# Patient Record
Sex: Female | Born: 1995 | Race: White | Hispanic: No | Marital: Single | Smoking: Never smoker
Health system: Southern US, Community
[De-identification: ages and names within clinical notes are randomized; demographics above are authoritative.]

## PROBLEM LIST (undated history)

## (undated) ENCOUNTER — Inpatient Hospital Stay: Payer: Self-pay

## (undated) DIAGNOSIS — Z87440 Personal history of urinary (tract) infections: Secondary | ICD-10-CM

## (undated) HISTORY — DX: Personal history of urinary (tract) infections: Z87.440

## (undated) HISTORY — PX: KIDNEY SURGERY: SHX687

---

## 2016-10-23 ENCOUNTER — Other Ambulatory Visit: Payer: Self-pay | Admitting: Obstetrics and Gynecology

## 2016-10-23 ENCOUNTER — Encounter: Payer: Self-pay | Admitting: Obstetrics and Gynecology

## 2016-10-23 ENCOUNTER — Ambulatory Visit (INDEPENDENT_AMBULATORY_CARE_PROVIDER_SITE_OTHER): Payer: Medicaid Other

## 2016-10-23 ENCOUNTER — Ambulatory Visit (INDEPENDENT_AMBULATORY_CARE_PROVIDER_SITE_OTHER): Payer: Medicaid Other | Admitting: Obstetrics and Gynecology

## 2016-10-23 VITALS — BP 111/71 | HR 111 | Ht 67.0 in | Wt 184.6 lb

## 2016-10-23 DIAGNOSIS — Z3402 Encounter for supervision of normal first pregnancy, second trimester: Secondary | ICD-10-CM

## 2016-10-23 DIAGNOSIS — Z3492 Encounter for supervision of normal pregnancy, unspecified, second trimester: Secondary | ICD-10-CM

## 2016-10-23 DIAGNOSIS — Z23 Encounter for immunization: Secondary | ICD-10-CM

## 2016-10-23 DIAGNOSIS — Z131 Encounter for screening for diabetes mellitus: Secondary | ICD-10-CM

## 2016-10-23 DIAGNOSIS — Z369 Encounter for antenatal screening, unspecified: Secondary | ICD-10-CM

## 2016-10-23 DIAGNOSIS — O0932 Supervision of pregnancy with insufficient antenatal care, second trimester: Secondary | ICD-10-CM

## 2016-10-23 DIAGNOSIS — Z113 Encounter for screening for infections with a predominantly sexual mode of transmission: Secondary | ICD-10-CM

## 2016-10-23 LAB — OB RESULTS CONSOLE VARICELLA ZOSTER ANTIBODY, IGG: Varicella: IMMUNE

## 2016-10-23 MED ORDER — CITRANATAL HARMONY 27-1-260 MG PO CAPS: 27.0000 mg | ORAL_CAPSULE | Freq: Every day | ORAL | 11 refills | Status: DC

## 2016-10-23 NOTE — Progress Notes (Signed)
OBSTETRIC INITIAL PRENATAL VISIT  Subjective:    Paige Rodriguez is being seen today for her first obstetrical visit.  This is not a planned pregnancy.  Patient had confirmation at  [facility] and 1 visit. She is a G1P0 female at 1044w2d gestation, Estimated Date of Delivery: 03/24/17 with Patient's last menstrual period was 06/17/2016 (approximate).  Her obstetrical history is significant for none.  I Relationship with FOB: significant other, not living together. Patient does intend to breast feed. Pregnancy history fully reviewed.    Obstetric History   G1   P0   T0   P0   A0   L0    SAB0   TAB0   Ectopic0   Multiple0   Live Births0     # Outcome Date GA Lbr Len/2nd Weight Sex Delivery Anes PTL Lv  1 Current               Gynecologic History:  No prior pap history due to age.  Denies history of STIs.    Past Medical History:  Diagnosis Date  . History of recurrent UTIs    age 89-7, had surgery to correct on renal tubes     No family history on file.   Past Surgical History:  Procedure Laterality Date  . KIDNEY SURGERY     had surgery on renal tubules for h/o recurrent UTIs at ~ age 89     Social History   Social History  . Marital status: Single    Spouse name: N/A  . Number of children: N/A  . Years of education: N/A   Occupational History  . Not on file.   Social History Main Topics  . Smoking status: Never Smoker  . Smokeless tobacco: Never Used  . Alcohol use Not on file  . Drug use: No  . Sexual activity: Not on file   Other Topics Concern  . Not on file   Social History Narrative  . No narrative on file     No current outpatient prescriptions on file prior to visit.   No current facility-administered medications on file prior to visit.      No Known Allergies   Review of Systems General:Not Present- Fever, Weight Loss and Weight Gain. Skin:Not Present- Rash. HEENT:Not Present- Blurred Vision, Headache and Bleeding Gums. Respiratory:Not  Present- Difficulty Breathing. Breast:Not Present- Breast Mass. Cardiovascular:Not Present- Chest Pain, Elevated Blood Pressure, Fainting / Blacking Out and Shortness of Breath. Gastrointestinal:Not Present- Abdominal Pain, Constipation, Nausea and Vomiting. Female Genitourinary:Not Present- Frequency, Painful Urination, Pelvic Pain, Vaginal Bleeding, Vaginal Discharge, Contractions, regular, Fetal Movements Decreased, Urinary Complaints and Vaginal Fluid. Musculoskeletal:Not Present- Back Pain and Leg Cramps. Neurological:Not Present- Dizziness. Psychiatric:Not Present- Depression.     Objective:   Blood pressure 111/71, pulse (!) 111, height 5\' 7"  (1.702 m), weight 184 lb 9.6 oz (83.7 kg), last menstrual period 06/17/2016.  Body mass index is 28.91 kg/m.  General Appearance:    Alert, cooperative, no distress, appears stated age, overweight  Head:    Normocephalic, without obvious abnormality, atraumatic  Eyes:    PERRL, conjunctiva/corneas clear, EOM's intact, both eyes  Ears:    Normal external ear canals, both ears  Nose:   Nares normal, septum midline, mucosa normal, no drainage or sinus tenderness  Throat:   Lips, mucosa, and tongue normal; teeth and gums normal  Neck:   Supple, symmetrical, trachea midline, no adenopathy; thyroid: no enlargement/tenderness/nodules; no carotid bruit or JVD  Back:     Symmetric,  no curvature, ROM normal, no CVA tenderness  Lungs:     Clear to auscultation bilaterally, respirations unlabored  Chest Wall:    No tenderness or deformity   Heart:    Regular rate and rhythm, S1 and S2 normal, no murmur, rub or gallop  Breast Exam:    No tenderness, masses, or nipple abnormality  Abdomen:     Soft, non-tender, bowel sounds active all four quadrants, no masses, no organomegaly.  FH 23 cm.  FHT 148  bpm.  Genitalia:    Pelvic:external genitalia normal, vagina without lesions, discharge, or tenderness, rectovaginal septum  normal. Cervix normal in  appearance, no cervical motion tenderness, no adnexal masses or tenderness.  Pregnancy positive findings: uterine enlargement: 23 wk size, nontender.   Rectal:    Normal external sphincter.  No hemorrhoids appreciated. Internal exam not done.   Extremities:   Extremities normal, atraumatic, no cyanosis or edema  Pulses:   2+ and symmetric all extremities  Skin:   Skin color, texture, turgor normal, no rashes or lesions  Lymph nodes:   Cervical, supraclavicular, and axillary nodes normal  Neurologic:   CNII-XII intact, normal strength, sensation and reflexes throughout       Assessment:   Pregnancy at unknown gestation (size greater than dates today).  Insufficient prenatal care   Plan:   Initial labs ordered. Prenatal vitamins encouraged.  Samples given.  Problem list reviewed and updated. New OB counseling:  The patient has been given an overview regarding routine prenatal care.  Recommendations regarding diet, weight gain, and exercise in pregnancy were given. Prenatal testing, optional genetic testing, and ultrasound use in pregnancy were reviewed.  AFP3 discussed: patient out of range by dates. Discussed quad screen, if patient within range by dates, and also Panorama testin (no limits on gestational age). Patient desires to think about it.  Benefits of Breast Feeding were discussed. The patient is encouraged to consider nursing her baby post partum. Follow up in 4 weeks.  Will likely need glucola screen at that time.  Flu vaccine given today Ultrasound ordered for later today, discussed S>D causes, including incorrect dating from unsure LMP, fetal anomaly, polyhydramnios, or multiple gestation.    50% of 30 min visit spent on counseling and coordination of care.     Hildred LaserAnika Ziona Wickens, MD [facility]

## 2016-10-23 NOTE — Patient Instructions (Signed)

## 2016-10-24 LAB — URINALYSIS, ROUTINE W REFLEX MICROSCOPIC
Bilirubin, UA: NEGATIVE
GLUCOSE, UA: NEGATIVE
Ketones, UA: NEGATIVE
Nitrite, UA: NEGATIVE
PH UA: 7.5 (ref 5.0–7.5)
PROTEIN UA: NEGATIVE
RBC, UA: NEGATIVE
Specific Gravity, UA: 1.015 (ref 1.005–1.030)
Urobilinogen, Ur: 0.2 mg/dL (ref 0.2–1.0)

## 2016-10-24 LAB — CBC WITH DIFFERENTIAL/PLATELET
BASOS: 0 %
Basophils Absolute: 0 10*3/uL (ref 0.0–0.2)
EOS (ABSOLUTE): 0.1 10*3/uL (ref 0.0–0.4)
EOS: 2 %
HEMATOCRIT: 34.9 % (ref 34.0–46.6)
Hemoglobin: 12 g/dL (ref 11.1–15.9)
IMMATURE GRANULOCYTES: 2 %
Immature Grans (Abs): 0.2 10*3/uL — ABNORMAL HIGH (ref 0.0–0.1)
LYMPHS ABS: 1.6 10*3/uL (ref 0.7–3.1)
Lymphs: 18 %
MCH: 29.2 pg (ref 26.6–33.0)
MCHC: 34.4 g/dL (ref 31.5–35.7)
MCV: 85 fL (ref 79–97)
MONOS ABS: 1.2 10*3/uL — AB (ref 0.1–0.9)
Monocytes: 13 %
NEUTROS ABS: 6.1 10*3/uL (ref 1.4–7.0)
NEUTROS PCT: 65 %
Platelets: 216 10*3/uL (ref 150–379)
RBC: 4.11 x10E6/uL (ref 3.77–5.28)
RDW: 14.3 % (ref 12.3–15.4)
WBC: 9.3 10*3/uL (ref 3.4–10.8)

## 2016-10-24 LAB — ABO AND RH: Rh Factor: POSITIVE

## 2016-10-24 LAB — MICROSCOPIC EXAMINATION
CASTS: NONE SEEN /LPF
Epithelial Cells (non renal): >10 /hpf — AB (ref 0–10)

## 2016-10-24 LAB — ANTIBODY SCREEN: Antibody Screen: NEGATIVE

## 2016-10-24 LAB — HEPATITIS B SURFACE ANTIGEN: Hepatitis B Surface Ag: NEGATIVE

## 2016-10-24 LAB — RUBELLA SCREEN: Rubella Antibodies, IGG: 1.75 index (ref >0.99)

## 2016-10-24 LAB — GC/CHLAMYDIA PROBE AMP
CHLAMYDIA, DNA PROBE: NEGATIVE
NEISSERIA GONORRHOEAE BY PCR: NEGATIVE

## 2016-10-24 LAB — VARICELLA ZOSTER ANTIBODY, IGG: VARICELLA: 478 {index} (ref >165)

## 2016-10-24 LAB — RPR: RPR: NONREACTIVE

## 2016-10-24 LAB — HIV ANTIBODY (ROUTINE TESTING W REFLEX): HIV SCREEN 4TH GENERATION: NONREACTIVE

## 2016-10-24 LAB — SICKLE CELL SCREEN: SICKLE CELL SCREEN: NEGATIVE

## 2016-10-25 LAB — URINE CULTURE

## 2016-10-27 ENCOUNTER — Encounter: Payer: Self-pay | Admitting: Obstetrics and Gynecology

## 2016-10-27 DIAGNOSIS — O0932 Supervision of pregnancy with insufficient antenatal care, second trimester: Secondary | ICD-10-CM | POA: Insufficient documentation

## 2016-11-19 ENCOUNTER — Ambulatory Visit (INDEPENDENT_AMBULATORY_CARE_PROVIDER_SITE_OTHER): Payer: Medicaid Other | Admitting: Obstetrics and Gynecology

## 2016-11-19 ENCOUNTER — Other Ambulatory Visit: Payer: Medicaid Other

## 2016-11-19 ENCOUNTER — Encounter: Payer: Medicaid Other | Admitting: Obstetrics and Gynecology

## 2016-11-19 VITALS — BP 109/63 | HR 94 | Wt 189.6 lb

## 2016-11-19 DIAGNOSIS — Z23 Encounter for immunization: Secondary | ICD-10-CM | POA: Diagnosis not present

## 2016-11-19 DIAGNOSIS — Z3009 Encounter for other general counseling and advice on contraception: Secondary | ICD-10-CM

## 2016-11-19 DIAGNOSIS — Z3403 Encounter for supervision of normal first pregnancy, third trimester: Secondary | ICD-10-CM

## 2016-11-19 LAB — POCT URINALYSIS DIPSTICK
Bilirubin, UA: NEGATIVE
GLUCOSE UA: NEGATIVE
Ketones, UA: NEGATIVE
Nitrite, UA: NEGATIVE
PH UA: 7
PROTEIN UA: NEGATIVE
RBC UA: NEGATIVE
SPEC GRAV UA: 1.015
UROBILINOGEN UA: NEGATIVE

## 2016-11-19 NOTE — Progress Notes (Signed)
ROB: Denies complaints. For 28 week labs today.  Desires to breastfeed,  Desires unsure method for contraception. Discussed all forms of contraception, handout given. To discuss at further visits. For Tdap today, signed blood consent, discussed cord blood banking. RTC in 2 weeks.

## 2016-11-19 NOTE — Addendum Note (Signed)
Addended by: Lunette StandsSIEMIENSKI, Shatara Stanek J on: 11/19/2016 03:12 PM   Modules accepted: Orders

## 2016-11-20 LAB — GLUCOSE, 1 HOUR GESTATIONAL: GESTATIONAL DIABETES SCREEN: 98 mg/dL (ref 65–139)

## 2016-12-10 ENCOUNTER — Ambulatory Visit (INDEPENDENT_AMBULATORY_CARE_PROVIDER_SITE_OTHER): Payer: Medicaid Other | Admitting: Obstetrics and Gynecology

## 2016-12-10 VITALS — BP 127/73 | HR 94 | Wt 198.9 lb

## 2016-12-10 DIAGNOSIS — J029 Acute pharyngitis, unspecified: Secondary | ICD-10-CM | POA: Diagnosis not present

## 2016-12-10 DIAGNOSIS — Z3403 Encounter for supervision of normal first pregnancy, third trimester: Secondary | ICD-10-CM

## 2016-12-10 LAB — POCT RAPID STREP A (OFFICE): Rapid Strep A Screen: NEGATIVE

## 2016-12-10 LAB — POCT URINALYSIS DIPSTICK
BILIRUBIN UA: NEGATIVE
GLUCOSE UA: NEGATIVE
KETONES UA: NEGATIVE
Nitrite, UA: NEGATIVE
Protein, UA: NEGATIVE
RBC UA: NEGATIVE
SPEC GRAV UA: 1.015
UROBILINOGEN UA: NEGATIVE
pH, UA: 8

## 2016-12-10 NOTE — Progress Notes (Signed)
ROB: Patient complains of sore throat, difficulty swallowing for 3-4 days. Noted URI symptoms last week which have resolved, but throat symptoms are new.  Also notes feeling chills.  Throat with erythema noted near tonsils, no exudate. Strep throat culture today negative. Advised on throat chloraseptic spray, saline rinse, throat lozenges, and robitussin as needed. RTC in 2 weeks.

## 2016-12-15 NOTE — L&D Delivery Note (Signed)
[facility]  Labor Events:   Preterm labor: no  Rupture date: 02/09/17  Rupture time: 1030  Rupture type: Spontaneous  Fluid Color: clear  Induction: N/A  Augmentation: Pitocin  Complications: Nuchal cord x 1   Cervical ripening:  N/A        Delivery:   Episiotomy: none  Lacerations: 2 degree  Repair suture: 2-0 vicryl , 3-0 vicryl  Repair # of packets: 1 of each  Blood loss (ml): 300   Information for the patient's newborn:  Dorcas CarrowHerold, Girl Victoire [[id number]][[id number]]    Delivery 02/10/2017 5:12 AM by  Vaginal, Spontaneous Delivery Sex:  female Gestational Age: 4138w3d Delivery Clinician:   Living?:         APGARS  One minute Five minutes Ten minutes  Skin color:        Heart rate:        Grimace:        Muscle tone:        Breathing:        Totals: 8  9      Presentation/position:      Resuscitation:   Cord information:    Disposition of cord blood:     Blood gases sent?  Complications:   Placenta: Delivered:       appearance Newborn Measurements: Weight: 8 lb 3.2 oz (3720 g)  Height: 20.47"  Head circumference:    Chest circumference:    Other providers:    Additional  information: Forceps:   Vacuum:   Breech:   Observed anomalies       Delivery Note At 5:12 AM a viable female was delivered via Vaginal, Spontaneous Delivery (Presentation: vertex, ROA  ).  APGAR: 8, 9; weight  .   Placenta status: spontaneous, intact .  Cord:  3 vessels with the following complications: Nuchal x 1 reduced, true knot x 1.  Cord pH: n/a  Anesthesia:  Epidural Episiotomy:  N/a Lacerations: 2 degree perineal  Suture Repair: 2.0 3.0 vicryl Est. Blood Loss (mL):  300  Mom to postpartum.  Baby to Couplet care / Skin to Skin.  Doreene Burkennie Zell Hylton 02/10/2017, 6:06 AM

## 2016-12-24 ENCOUNTER — Ambulatory Visit (INDEPENDENT_AMBULATORY_CARE_PROVIDER_SITE_OTHER): Payer: Medicaid Other | Admitting: Obstetrics and Gynecology

## 2016-12-24 VITALS — BP 99/64 | HR 106 | Wt 201.2 lb

## 2016-12-24 DIAGNOSIS — Z3403 Encounter for supervision of normal first pregnancy, third trimester: Secondary | ICD-10-CM

## 2016-12-24 LAB — POCT URINALYSIS DIPSTICK
BILIRUBIN UA: NEGATIVE
Glucose, UA: NEGATIVE
Ketones, UA: NEGATIVE
Leukocytes, UA: NEGATIVE
NITRITE UA: NEGATIVE
PH UA: 7
PROTEIN UA: NEGATIVE
RBC UA: NEGATIVE
Spec Grav, UA: 1.01
UROBILINOGEN UA: NEGATIVE

## 2016-12-24 NOTE — Progress Notes (Signed)
ROB: Patient denies complaints. Doing well.  RTC in 3 weeks. For 36 week labs at that time.

## 2017-01-14 ENCOUNTER — Ambulatory Visit (INDEPENDENT_AMBULATORY_CARE_PROVIDER_SITE_OTHER): Payer: Medicaid Other | Admitting: Obstetrics and Gynecology

## 2017-01-14 VITALS — BP 121/72 | HR 111 | Wt 200.5 lb

## 2017-01-14 DIAGNOSIS — Z3403 Encounter for supervision of normal first pregnancy, third trimester: Secondary | ICD-10-CM

## 2017-01-14 DIAGNOSIS — Z113 Encounter for screening for infections with a predominantly sexual mode of transmission: Secondary | ICD-10-CM

## 2017-01-14 DIAGNOSIS — Z3493 Encounter for supervision of normal pregnancy, unspecified, third trimester: Secondary | ICD-10-CM | POA: Insufficient documentation

## 2017-01-14 DIAGNOSIS — Z3685 Encounter for antenatal screening for Streptococcus B: Secondary | ICD-10-CM

## 2017-01-14 LAB — POCT URINALYSIS DIPSTICK
Bilirubin, UA: NEGATIVE
Glucose, UA: NEGATIVE
KETONES UA: NEGATIVE
Nitrite, UA: NEGATIVE
PH UA: 7.5
PROTEIN UA: NEGATIVE
SPEC GRAV UA: 1.01
Urobilinogen, UA: NEGATIVE

## 2017-01-14 LAB — OB RESULTS CONSOLE GBS: GBS: NEGATIVE

## 2017-01-14 NOTE — Progress Notes (Signed)
ROB: Doing well, no complaints. 36 week labs today.  Discussed labor precautions. RTC in 1 week.

## 2017-01-16 LAB — GC/CHLAMYDIA PROBE AMP
Chlamydia trachomatis, NAA: NEGATIVE
Neisseria gonorrhoeae by PCR: NEGATIVE

## 2017-01-18 LAB — CULTURE, BETA STREP (GROUP B ONLY): STREP GP B CULTURE: NEGATIVE

## 2017-01-21 ENCOUNTER — Ambulatory Visit (INDEPENDENT_AMBULATORY_CARE_PROVIDER_SITE_OTHER): Payer: Medicaid Other | Admitting: Obstetrics and Gynecology

## 2017-01-21 VITALS — BP 103/65 | HR 97 | Wt 206.2 lb

## 2017-01-21 DIAGNOSIS — Z3403 Encounter for supervision of normal first pregnancy, third trimester: Secondary | ICD-10-CM

## 2017-01-21 DIAGNOSIS — O479 False labor, unspecified: Secondary | ICD-10-CM

## 2017-01-21 LAB — POCT URINALYSIS DIPSTICK
Bilirubin, UA: NEGATIVE
Blood, UA: NEGATIVE
Glucose, UA: NEGATIVE
Ketones, UA: NEGATIVE
Nitrite, UA: NEGATIVE
Protein, UA: NEGATIVE
Spec Grav, UA: 1.025
Urobilinogen, UA: NEGATIVE
pH, UA: 7

## 2017-01-21 NOTE — Progress Notes (Signed)
ROB: Notes feeling tired but otherwise ok.  Has had some back pain, using heating pad. Can use Tylenol prn. Notes some Deberah PeltonBraxton Hicks. Declines exam today. GBS negative. RTC in 1 week.

## 2017-01-23 ENCOUNTER — Observation Stay
Admission: EM | Admit: 2017-01-23 | Discharge: 2017-01-24 | Disposition: A | Discharge status: Home / Self Care | Payer: Medicaid Other | Attending: Obstetrics and Gynecology | Admitting: Obstetrics and Gynecology

## 2017-01-23 DIAGNOSIS — O368130 Decreased fetal movements, third trimester, not applicable or unspecified: Principal | ICD-10-CM | POA: Insufficient documentation

## 2017-01-23 DIAGNOSIS — Z3A38 38 weeks gestation of pregnancy: Secondary | ICD-10-CM | POA: Insufficient documentation

## 2017-01-23 DIAGNOSIS — O0933 Supervision of pregnancy with insufficient antenatal care, third trimester: Secondary | ICD-10-CM | POA: Insufficient documentation

## 2017-01-23 DIAGNOSIS — O368190 Decreased fetal movements, unspecified trimester, not applicable or unspecified: Secondary | ICD-10-CM

## 2017-01-23 NOTE — OB Triage Note (Signed)
Pt presents to l&d with c/o decreased FM x 3 hrs despite attempts to make fetus move. EFM applied and explained, plan to monitor fetal and maternal well being.

## 2017-01-24 ENCOUNTER — Observation Stay: Payer: Medicaid Other

## 2017-01-24 DIAGNOSIS — O368131 Decreased fetal movements, third trimester, fetus 1: Secondary | ICD-10-CM

## 2017-01-24 DIAGNOSIS — O0933 Supervision of pregnancy with insufficient antenatal care, third trimester: Secondary | ICD-10-CM | POA: Diagnosis not present

## 2017-01-24 DIAGNOSIS — O368130 Decreased fetal movements, third trimester, not applicable or unspecified: Secondary | ICD-10-CM | POA: Diagnosis present

## 2017-01-24 DIAGNOSIS — Z3A38 38 weeks gestation of pregnancy: Secondary | ICD-10-CM | POA: Diagnosis not present

## 2017-01-24 NOTE — Discharge Instructions (Signed)
Labor precautions:  Call office or follow up at Labor and Delivery for the following: 1) Contractions less than 7 minutes apart lasting longer than 1 hour 2) Rupture of membranes 3) Vaginal bleeding requiring a pad 4) Decreased fetal movement

## 2017-01-24 NOTE — Final Progress Note (Signed)
L&D OB Triage Note  HPI:  Paige Rodriguez is a 21 y.o. G1P0 female at [redacted]w[redacted]d. Estimated Date of Delivery: 02/07/17 who presented overnight for decreased fetal movement x 3 hours, despite attempting measures at home to promote fetal movement.  Upon arrival to triage after patient was placed on fetal monitors, patient began to detect fetal movement.  While being monitored, the patient was noted to have several variable decelerations from baseline of 140s down to 110-120s.  She was admitted overnight for extended monitoring and ultrasound to assess AFI.     OB History  Gravida Para Term Preterm AB Living  1            SAB TAB Ectopic Multiple Live Births               # Outcome Date GA Lbr Len/2nd Weight Sex Delivery Anes PTL Lv  1 Current               Patient Active Problem List   Diagnosis Date Noted  . Labor and delivery, indication for care 01/24/2017  . Supervision of normal pregnancy in third trimester 01/14/2017  . Insufficient prenatal care in second trimester 10/27/2016    Past Medical History:  Diagnosis Date  . History of recurrent UTIs    age 46-7, had surgery to correct on renal tubes    No current facility-administered medications on file prior to encounter.    Current Outpatient Prescriptions on File Prior to Encounter  Medication Sig Dispense Refill  . acetaminophen (TYLENOL) 325 MG tablet Take 650 mg by mouth every 6 (six) hours as needed.    . Prenat-FeFmCb-DSS-FA-DHA w/o A (CITRANATAL HARMONY) 27-1-260 MG CAPS Take 27 mg by mouth daily. 30 capsule 11    No Known Allergies   ROS:  Review of Systems - Denies vaginal bleeding, contractions, leaking fluids.  Does report decreased movement for several hours.  All remaining review of systems negative.    Physical Exam:  Blood pressure (!) 96/59, pulse 88, temperature 97.4 F (36.3 C), temperature source Oral, resp. rate 18, height 5\' 7"  (1.702 m), weight 206 lb (93.4 kg), last menstrual period 06/17/2016. General  appearance: alert and no distress Abdomen: soft, non-tender; bowel sounds normal; no masses,  no organomegaly, gravid.  Pelvic: deferred Extremities: extremities normal, atraumatic, no cyanosis or edema   NST INTERPRETATION: Indications: decreased fetal movement  Mode: External Baseline Rate (A): 120 bpm Variability: Moderate Accelerations: 15 x 15 Decelerations: None     Contraction Frequency (min): x2  Impression: reactive    Imaging:  CLINICAL DATA:  Decreased fetal movement for 1 day  EXAM: LIMITED OBSTETRIC ULTRASOUND  FINDINGS: Number of Fetuses: 1  Heart Rate:  132 bpm  Movement: Yes  Presentation: Cephalic  Placental Location: Posterior  Previa: No  Amniotic Fluid (Subjective):  Within normal limits.  BPD:  8.97cm 36w  2d  MATERNAL FINDINGS:  Cervix:  Appears closed.  Normal length measuring 3.7 cm.  Uterus/Adnexae:  No abnormality visualized.  IMPRESSION: Single live intrauterine pregnancy as described above.  This exam is performed on an emergent basis and does not comprehensively evaluate fetal size, dating, or anatomy; follow-up complete OB US should be considered if further fetal assessment is warranted.   Electronically Signed   By: Elige Ko   On: 01/24/2017 08:32   Assessment:  21 y.o. G1P0 at [redacted]w[redacted]d with:  1.  Decreased fetal movement 2. Category 2 tracing   Plan:  1. Decreased fetal movement resolved once  patient arrived in triage.  Has noted good movement since that time.  2. Category 2 tracing initially, now improved to Category 1 after extended fetal monitoring.   3. Can d/c home.  Reiterated fetal kick counts, labor precautions.   Hildred LaserAnika Ondra Deboard, MD [facility]

## 2017-01-28 ENCOUNTER — Ambulatory Visit (INDEPENDENT_AMBULATORY_CARE_PROVIDER_SITE_OTHER): Payer: Medicaid Other | Admitting: Obstetrics and Gynecology

## 2017-01-28 VITALS — BP 108/89 | HR 89 | Wt 207.3 lb

## 2017-01-28 DIAGNOSIS — Z3403 Encounter for supervision of normal first pregnancy, third trimester: Secondary | ICD-10-CM

## 2017-01-28 LAB — POCT URINALYSIS DIPSTICK
BILIRUBIN UA: NEGATIVE
GLUCOSE UA: NEGATIVE
KETONES UA: NEGATIVE
Nitrite, UA: NEGATIVE
Protein, UA: NEGATIVE
SPEC GRAV UA: 1.015
Urobilinogen, UA: NEGATIVE
pH, UA: 7

## 2017-01-28 NOTE — Progress Notes (Signed)
ROB: Patient seen in triage over the weekend for complaints of decreased FM.  Notes good FM today.

## 2017-01-30 ENCOUNTER — Telehealth: Payer: Self-pay | Admitting: Obstetrics and Gynecology

## 2017-01-30 NOTE — Telephone Encounter (Signed)
38 wks feels like she's been leaking, decease in appt, sudden headache, today she feels pressure. jusr wanted to talk to someione

## 2017-02-02 NOTE — Telephone Encounter (Signed)
Sent mychart message

## 2017-02-04 ENCOUNTER — Ambulatory Visit (INDEPENDENT_AMBULATORY_CARE_PROVIDER_SITE_OTHER): Payer: Medicaid Other | Admitting: Obstetrics and Gynecology

## 2017-02-04 ENCOUNTER — Encounter: Payer: Self-pay | Admitting: Obstetrics and Gynecology

## 2017-02-04 VITALS — BP 110/71 | HR 97 | Wt 206.4 lb

## 2017-02-04 DIAGNOSIS — Z3403 Encounter for supervision of normal first pregnancy, third trimester: Secondary | ICD-10-CM

## 2017-02-04 LAB — POCT URINALYSIS DIPSTICK
Bilirubin, UA: NEGATIVE
Blood, UA: NEGATIVE
Glucose, UA: NEGATIVE
Ketones, UA: NEGATIVE
LEUKOCYTES UA: NEGATIVE
Nitrite, UA: NEGATIVE
PH UA: 6.5
PROTEIN UA: NEGATIVE
Spec Grav, UA: 1.01
UROBILINOGEN UA: NEGATIVE

## 2017-02-04 NOTE — Progress Notes (Signed)
S&S discussed. NST Monday BPP Tues

## 2017-02-05 ENCOUNTER — Other Ambulatory Visit: Payer: Self-pay | Admitting: Obstetrics and Gynecology

## 2017-02-05 DIAGNOSIS — O368130 Decreased fetal movements, third trimester, not applicable or unspecified: Secondary | ICD-10-CM

## 2017-02-09 ENCOUNTER — Inpatient Hospital Stay: Payer: Medicaid Other | Admitting: Anesthesiology

## 2017-02-09 ENCOUNTER — Inpatient Hospital Stay
Admission: RE | Admit: 2017-02-09 | Discharge: 2017-02-12 | DRG: 775 | Disposition: A | Discharge status: Home / Self Care | Payer: Medicaid Other | Attending: Obstetrics and Gynecology | Admitting: Obstetrics and Gynecology

## 2017-02-09 ENCOUNTER — Ambulatory Visit (INDEPENDENT_AMBULATORY_CARE_PROVIDER_SITE_OTHER): Payer: Medicaid Other | Admitting: Certified Nurse Midwife

## 2017-02-09 ENCOUNTER — Encounter: Payer: Self-pay | Admitting: *Deleted

## 2017-02-09 VITALS — BP 117/76 | HR 92 | Wt 206.0 lb

## 2017-02-09 DIAGNOSIS — O4202 Full-term premature rupture of membranes, onset of labor within 24 hours of rupture: Secondary | ICD-10-CM | POA: Diagnosis present

## 2017-02-09 DIAGNOSIS — O480 Post-term pregnancy: Principal | ICD-10-CM

## 2017-02-09 DIAGNOSIS — Z3A40 40 weeks gestation of pregnancy: Secondary | ICD-10-CM | POA: Diagnosis not present

## 2017-02-09 DIAGNOSIS — Z1389 Encounter for screening for other disorder: Secondary | ICD-10-CM

## 2017-02-09 DIAGNOSIS — Z3403 Encounter for supervision of normal first pregnancy, third trimester: Secondary | ICD-10-CM

## 2017-02-09 DIAGNOSIS — O4290 Premature rupture of membranes, unspecified as to length of time between rupture and onset of labor, unspecified weeks of gestation: Secondary | ICD-10-CM | POA: Diagnosis present

## 2017-02-09 LAB — CBC
HCT: 35.3 % (ref 35.0–47.0)
Hemoglobin: 12.4 g/dL (ref 12.0–16.0)
MCH: 30 pg (ref 26.0–34.0)
MCHC: 35 g/dL (ref 32.0–36.0)
MCV: 85.9 fL (ref 80.0–100.0)
Platelets: 185 K/uL (ref 150–440)
RBC: 4.11 MIL/uL (ref 3.80–5.20)
RDW: 14 % (ref 11.5–14.5)
WBC: 15.5 K/uL — ABNORMAL HIGH (ref 3.6–11.0)

## 2017-02-09 LAB — URINE DRUG SCREEN, QUALITATIVE (ARMC ONLY)
AMPHETAMINES, UR SCREEN: NOT DETECTED
Barbiturates, Ur Screen: NOT DETECTED
Benzodiazepine, Ur Scrn: NOT DETECTED
CANNABINOID 50 NG, UR ~~LOC~~: NOT DETECTED
COCAINE METABOLITE, UR ~~LOC~~: NOT DETECTED
MDMA (ECSTASY) UR SCREEN: NOT DETECTED
Methadone Scn, Ur: NOT DETECTED
Opiate, Ur Screen: NOT DETECTED
PHENCYCLIDINE (PCP) UR S: NOT DETECTED
Tricyclic, Ur Screen: NOT DETECTED

## 2017-02-09 LAB — POCT URINALYSIS DIPSTICK
Bilirubin, UA: NEGATIVE
Glucose, UA: NEGATIVE
Ketones, UA: NEGATIVE
NITRITE UA: NEGATIVE
PH UA: 7.5
Protein, UA: NEGATIVE
Spec Grav, UA: 1.01
UROBILINOGEN UA: NEGATIVE

## 2017-02-09 LAB — CHLAMYDIA/NGC RT PCR (ARMC ONLY)
Chlamydia Tr: NOT DETECTED
N gonorrhoeae: NOT DETECTED

## 2017-02-09 LAB — TYPE AND SCREEN
ABO/RH(D): A POS
Antibody Screen: NEGATIVE

## 2017-02-09 LAB — RAPID HIV SCREEN (HIV 1/2 AB+AG)
HIV 1/2 Antibodies: NONREACTIVE
HIV-1 P24 Antigen - HIV24: NONREACTIVE

## 2017-02-09 MED ORDER — BUTORPHANOL TARTRATE 1 MG/ML IJ SOLN: 1.0000 mg | INTRAMUSCULAR | Status: DC | PRN

## 2017-02-09 MED ORDER — SOD CITRATE-CITRIC ACID 500-334 MG/5ML PO SOLN
30.0000 mL | ORAL | Status: DC | PRN
  Filled 2017-02-09: qty 30

## 2017-02-09 MED ORDER — OXYCODONE-ACETAMINOPHEN 5-325 MG PO TABS: 1.0000 | ORAL_TABLET | ORAL | Status: DC | PRN

## 2017-02-09 MED ORDER — OXYTOCIN 40 UNITS IN LACTATED RINGERS INFUSION - SIMPLE MED
1.0000 m[IU]/min | INTRAVENOUS | Status: DC
  Administered 2017-02-09: 2 m[IU]/min via INTRAVENOUS

## 2017-02-09 MED ORDER — ACETAMINOPHEN 325 MG PO TABS: 650.0000 mg | ORAL_TABLET | ORAL | Status: DC | PRN

## 2017-02-09 MED ORDER — LACTATED RINGERS IV SOLN
INTRAVENOUS | Status: DC
  Administered 2017-02-09 (×2): via INTRAVENOUS

## 2017-02-09 MED ORDER — LIDOCAINE HCL (PF) 1 % IJ SOLN: 30.0000 mL | INTRAMUSCULAR | Status: DC | PRN

## 2017-02-09 MED ORDER — ONDANSETRON HCL 4 MG/2ML IJ SOLN: 4.0000 mg | Freq: Four times a day (QID) | INTRAMUSCULAR | Status: DC | PRN

## 2017-02-09 MED ORDER — OXYTOCIN 40 UNITS IN LACTATED RINGERS INFUSION - SIMPLE MED
2.5000 [IU]/h | INTRAVENOUS | Status: DC
  Administered 2017-02-10: 2.5 [IU]/h via INTRAVENOUS
  Filled 2017-02-09: qty 1000

## 2017-02-09 MED ORDER — TERBUTALINE SULFATE 1 MG/ML IJ SOLN: 0.2500 mg | Freq: Once | INTRAMUSCULAR | Status: DC | PRN

## 2017-02-09 MED ORDER — OXYCODONE-ACETAMINOPHEN 5-325 MG PO TABS: 2.0000 | ORAL_TABLET | ORAL | Status: DC | PRN

## 2017-02-09 MED ORDER — OXYTOCIN BOLUS FROM INFUSION
500.0000 mL | Freq: Once | INTRAVENOUS | Status: AC
  Administered 2017-02-10: 999500 mL via INTRAVENOUS

## 2017-02-09 MED ORDER — LACTATED RINGERS IV SOLN: 500.0000 mL | INTRAVENOUS | Status: DC | PRN

## 2017-02-09 NOTE — OB Triage Note (Signed)
Presents with [facility] at 1030 this morning. Pt went to office first and was nitrazine positive there. States she is having some contractions.

## 2017-02-09 NOTE — Anesthesia Procedure Notes (Signed)
Epidural Patient location during procedure: OB Start time: 02/09/2017 11:40 PM End time: 02/09/2017 11:57 PM  Staffing Anesthesiologist: Alver FisherPENWARDEN, [place] Performed: anesthesiologist   Preanesthetic Checklist Completed: patient identified, site marked, surgical consent, pre-op evaluation, timeout performed, IV checked, risks and benefits discussed and monitors and equipment checked  Epidural Patient position: sitting Prep: ChloraPrep Patient monitoring: heart rate, continuous pulse ox and blood pressure Approach: midline Location: L3-L4 Injection technique: LOR saline  Needle:  Needle type: Tuohy  Needle gauge: 18 G Needle length: 9 cm and 9 Needle insertion depth: 6 cm Catheter type: closed end flexible Catheter size: 20 Guage Catheter at skin depth: 10 cm Test dose: negative (0.125% bupivacaine)  Assessment Events: blood not aspirated, injection not painful, no injection resistance, negative IV test and no paresthesia  Additional Notes   Patient tolerated the insertion well without complications.Reason for block:procedure for pain

## 2017-02-09 NOTE — H&P (Signed)
Obstetric History and Physical  Paige Rodriguez is a 21 y.o. G1P0 with IUP at 6367w2d presenting for complaints of SROM since 10:30 a.m. Patient states she has been having  irregular contractions (mostly MantuaBraxton Hicks), none vaginal bleeding, ruptured, clear fluid membranes, with active fetal movement.   Patient was sent over from clinic today after confirmation of ruptured membranes (pooling on speculum exam and positive nitrazine)  Prenatal Course Source of Care: [facility] with onset of care at 24 weeks Pregnancy complications or risks: Patient Active Problem List   Diagnosis Date Noted  . PROM (premature rupture of membranes) 02/09/2017  . Post-dates pregnancy 02/09/2017  . Supervision of normal pregnancy in third trimester 01/14/2017  . Insufficient prenatal care in second trimester 10/27/2016   She plans to breastfeed She desires unsure method for postpartum contraception.   Prenatal labs and studies: ABO, Rh: --/--/A POS (02/26 1613) Antibody: NEG (02/26 1613) Rubella: 1.75 (11/09 1134) RPR: Non Reactive (11/09 1134)  HBsAg: Negative (11/09 1134)  HIV: Non Reactive (11/09 1134)  ZOX:WRUEAVWUGBS:Negative (01/31 1249) 1 hr Glucola  Normal Genetic screening none performed (declined) Anatomy US normal   Past Medical History:  Diagnosis Date  . History of recurrent UTIs    age [age], had surgery to correct on renal tubes    Past Surgical History:  Procedure Laterality Date  . KIDNEY SURGERY     had surgery on renal tubules for h/o recurrent UTIs    OB History  Gravida Para Term Preterm AB Living  1            SAB TAB Ectopic Multiple Live Births               # Outcome Date GA Lbr Len/2nd Weight Sex Delivery Anes PTL Lv  1 Current               Social History   Social History  . Marital status: Single    Spouse name: N/A  . Number of children: N/A  . Years of education: N/A   Social History Main Topics  . Smoking status: Never Smoker  . Smokeless tobacco:  Never Used  . Alcohol use No  . Drug use: No  . Sexual activity: Yes    Birth control/ protection: Implant   Other Topics Concern  . None   Social History Narrative  . None    History reviewed. No pertinent family history.  Prescriptions Prior to Admission  Medication Sig Dispense Refill Last Dose  . acetaminophen (TYLENOL) 325 MG tablet Take 650 mg by mouth every 6 (six) hours as needed.   Taking  . Prenat-FeFmCb-DSS-FA-DHA w/o A (CITRANATAL HARMONY) 27-1-260 MG CAPS Take 27 mg by mouth daily. 30 capsule 11 Taking    No Known Allergies  Review of Systems: Negative except for what is mentioned in HPI.  Physical Exam: BP 138/88 (BP Location: Left Arm)   Pulse 98   Temp 98.5 F (36.9 C) (Oral)   Resp 18   Ht 5\' 7"  (1.702 m)   Wt 206 lb (93.4 kg)   LMP 06/17/2016 (Approximate)   BMI 32.26 kg/m  CONSTITUTIONAL: Well-developed, well-nourished female in no acute distress.  HENT:  Normocephalic, atraumatic, External right and left ear normal. Oropharynx is clear and moist EYES: Conjunctivae and EOM are normal. Pupils are equal, round, and reactive to light. No scleral icterus.  NECK: Normal range of motion, supple, no masses SKIN: Skin is warm and dry. No rash noted. Not diaphoretic. No  erythema. No pallor. NEUROLOGIC: Alert and oriented to person, place, and time. Normal reflexes, muscle tone coordination. No cranial nerve deficit noted. PSYCHIATRIC: Normal mood and affect. Normal behavior. Normal judgment and thought content. CARDIOVASCULAR: Normal heart rate noted, regular rhythm RESPIRATORY: Effort and breath sounds normal, no problems with respiration noted ABDOMEN: Soft, nontender, nondistended, gravid. MUSCULOSKELETAL: Normal range of motion. No edema and no tenderness. 2+ distal pulses.  Cervical Exam: Dilatation 3cm   Effacement 60%   Station -3.  Grossly ruptured   Presentation: cephalic FHT:  Baseline rate 145 bpm   Variability moderate  Accelerations present    Decelerations none Contractions: Every 3-4 mins   Pertinent Labs/Studies:   Results for orders placed or performed during the hospital encounter of 02/09/17 (from the past 24 hour(s))  CBC     Status: Abnormal   Collection Time: 02/09/17  4:13 PM  Result Value Ref Range   WBC 15.5 (H) 3.6 - 11.0 K/uL   RBC 4.11 3.80 - 5.20 MIL/uL   Hemoglobin 12.4 12.0 - 16.0 g/dL   HCT 16.1 09.6 - 04.5 %   MCV 85.9 80.0 - 100.0 fL   MCH 30.0 26.0 - 34.0 pg   MCHC 35.0 32.0 - 36.0 g/dL   RDW 40.9 81.1 - 91.4 %   Platelets 185 150 - 440 K/uL  Type and screen [facility]     Status: None   Collection Time: 02/09/17  4:13 PM  Result Value Ref Range   ABO/RH(D) A POS    Antibody Screen NEG    Sample Expiration 02/12/2017   Rapid HIV screen (HIV 1/2 Ab+Ag) ([facility] Only)     Status: None   Collection Time: 02/09/17  4:13 PM  Result Value Ref Range   HIV-1 P24 Antigen - HIV24 NON REACTIVE NON REACTIVE   HIV 1/2 Antibodies NON REACTIVE NON REACTIVE   Interpretation (HIV Ag Ab)      A non reactive test result means that HIV 1 or HIV 2 antibodies and HIV 1 p24 antigen were not detected in the specimen.  Urine Drug Screen, Qualitative ([facility] only)     Status: None   Collection Time: 02/09/17  4:13 PM  Result Value Ref Range   Tricyclic, Ur Screen NONE DETECTED NONE DETECTED   Amphetamines, Ur Screen NONE DETECTED NONE DETECTED   MDMA (Ecstasy)Ur Screen NONE DETECTED NONE DETECTED   Cocaine Metabolite,Ur [place] NONE DETECTED NONE DETECTED   Opiate, Ur Screen NONE DETECTED NONE DETECTED   Phencyclidine (PCP) Ur S NONE DETECTED NONE DETECTED   Cannabinoid 50 Ng, Ur [place] NONE DETECTED NONE DETECTED   Barbiturates, Ur Screen NONE DETECTED NONE DETECTED   Benzodiazepine, Ur Scrn NONE DETECTED NONE DETECTED   Methadone Scn, Ur NONE DETECTED NONE DETECTED    Assessment : Paige Rodriguez is a 21 y.o. G1P0 at [redacted]w[redacted]d being admitted for labor.  Plan: Labor: Expectant management. Augmentation as  needed, per protocol.  Admission labs ordered.  May have epidural if desired.  FWB: Reassuring fetal heart tracing.  GBS negative. Delivery plan: Hopeful for vaginal delivery   Hildred Laser, MD [facility]

## 2017-02-09 NOTE — Progress Notes (Signed)
Paige Rodriguez presents today for evaluation of ROM. She describes a gush of clear fluid this morning at approximately 1030 that continues to leak. She is also having mild contractions every 5-10 minutes. She rates her pain 3/10. Sterile Spec exam show clear fluid in the vagina, Nitrazine positive, fern positive. SVE 3-4 cm, 60%,-2 station. She has positive fetal movement with heart tones at 147. She is GBS negative. Paige Rodriguez was instructed to go to Labor and delivery for admission. She requested to have a "snack" prior to admission. She will go get something to eat and then go to L&D. Dr. Valentino Saxonherry notified of finding.   Paige BurkeAnnie Zekiah Rodriguez, CNM

## 2017-02-09 NOTE — Progress Notes (Signed)
Intrapartum Progress Note  S: Patient denies complaints.  Notes that she is feeling contractions but only moderately painful.   O: Blood pressure 118/65, pulse 96, temperature 97.7 F (36.5 C), temperature source Oral, resp. rate 16, height 5\' 7"  (1.702 m), weight 206 lb (93.4 kg), last menstrual period 06/17/2016. Gen App: NAD, comfortable Abdomen: soft, gravid FHT: baseline 140 bpm.  Accels present.  Decels absent. moderate in degree variability.   Tocometer: contractions irregular (q 2-4) minutes Cervix: 3.5/90/-1 Extremities: Nontender, no edema.  Pitocin: None  Labs:  No new labs   Assessment:  1: SIUP at 4662w2d 2. PROM   Plan:  1. Will augment with Pitocin 2. Anticipate vaginal delivery 3. Monitor for s/s of chorioamnionitis if prolong ruptured    Hildred LaserAnika Amyre Segundo, MD 02/09/2017 9:31 PM

## 2017-02-09 NOTE — Patient Instructions (Signed)
May have a snack then go to L&D for admission due to rupture of membranes.

## 2017-02-09 NOTE — Progress Notes (Signed)
Pt presents for NST-post dates. Pt possibly leaking amniotic fluid, for the last couple of hours. FM good. Contractions approx every 5-4610min, lasting approx 1 min.  Dr. Valentino Saxonherry aware and Doreene BurkeAnnie Thompson, CNM will check pt to confirm vaginal fluid.

## 2017-02-10 ENCOUNTER — Encounter: Payer: Medicaid Other | Admitting: Obstetrics and Gynecology

## 2017-02-10 ENCOUNTER — Other Ambulatory Visit: Payer: Medicaid Other

## 2017-02-10 DIAGNOSIS — O480 Post-term pregnancy: Secondary | ICD-10-CM

## 2017-02-10 LAB — RPR: RPR: NONREACTIVE

## 2017-02-10 MED ORDER — FENTANYL 2.5 MCG/ML W/ROPIVACAINE 0.2% IN NS 100 ML EPIDURAL INFUSION (ARMC-ANES)
EPIDURAL | Status: AC
  Filled 2017-02-10: qty 100

## 2017-02-10 MED ORDER — DOCUSATE SODIUM 100 MG PO CAPS
100.0000 mg | ORAL_CAPSULE | Freq: Two times a day (BID) | ORAL | Status: DC
  Administered 2017-02-10 – 2017-02-12 (×4): 100 mg via ORAL
  Filled 2017-02-10 (×5): qty 1

## 2017-02-10 MED ORDER — AMMONIA AROMATIC IN INHA
RESPIRATORY_TRACT | Status: AC
  Filled 2017-02-10: qty 10

## 2017-02-10 MED ORDER — LACTATED RINGERS IV SOLN: INTRAVENOUS | Status: DC

## 2017-02-10 MED ORDER — LIDOCAINE HCL (PF) 1 % IJ SOLN
INTRAMUSCULAR | Status: AC
  Filled 2017-02-10: qty 30

## 2017-02-10 MED ORDER — DIBUCAINE 1 % RE OINT: 1.0000 "application " | TOPICAL_OINTMENT | RECTAL | Status: DC | PRN

## 2017-02-10 MED ORDER — NALBUPHINE HCL 10 MG/ML IJ SOLN: 5.0000 mg | Freq: Once | INTRAMUSCULAR | Status: DC | PRN

## 2017-02-10 MED ORDER — WITCH HAZEL-GLYCERIN EX PADS: 1.0000 "application " | MEDICATED_PAD | CUTANEOUS | Status: DC | PRN

## 2017-02-10 MED ORDER — MISOPROSTOL 200 MCG PO TABS
ORAL_TABLET | ORAL | Status: AC
  Filled 2017-02-10: qty 4

## 2017-02-10 MED ORDER — NALBUPHINE HCL 10 MG/ML IJ SOLN: 5.0000 mg | INTRAMUSCULAR | Status: DC | PRN

## 2017-02-10 MED ORDER — BENZOCAINE-MENTHOL 20-0.5 % EX AERO
1.0000 "application " | INHALATION_SPRAY | CUTANEOUS | Status: DC | PRN
  Administered 2017-02-10: 1 via TOPICAL
  Filled 2017-02-10: qty 56

## 2017-02-10 MED ORDER — ROPIVACAINE HCL 2 MG/ML IJ SOLN
10.0000 mL/h | INTRAMUSCULAR | Status: DC
  Filled 2017-02-10 (×3): qty 5

## 2017-02-10 MED ORDER — DEXTROSE 5 % IV SOLN
1.0000 ug/kg/h | INTRAVENOUS | Status: DC | PRN
  Filled 2017-02-10: qty 2

## 2017-02-10 MED ORDER — LIDOCAINE-EPINEPHRINE (PF) 1.5 %-1:200000 IJ SOLN
INTRAMUSCULAR | Status: DC | PRN
  Administered 2017-02-09: 3 mL

## 2017-02-10 MED ORDER — PRENATAL MULTIVITAMIN CH
1.0000 | ORAL_TABLET | Freq: Every day | ORAL | Status: DC
  Administered 2017-02-10 – 2017-02-12 (×3): 1 via ORAL
  Filled 2017-02-10 (×3): qty 1

## 2017-02-10 MED ORDER — DIPHENHYDRAMINE HCL 25 MG PO CAPS: 25.0000 mg | ORAL_CAPSULE | ORAL | Status: DC | PRN

## 2017-02-10 MED ORDER — SIMETHICONE 80 MG PO CHEW: 80.0000 mg | CHEWABLE_TABLET | ORAL | Status: DC | PRN

## 2017-02-10 MED ORDER — COCONUT OIL OIL
1.0000 "application " | TOPICAL_OIL | Status: DC | PRN
  Administered 2017-02-10: 1 via TOPICAL
  Filled 2017-02-10: qty 120

## 2017-02-10 MED ORDER — DIPHENHYDRAMINE HCL 50 MG/ML IJ SOLN: 12.5000 mg | INTRAMUSCULAR | Status: DC | PRN

## 2017-02-10 MED ORDER — FERROUS SULFATE 325 (65 FE) MG PO TABS
325.0000 mg | ORAL_TABLET | Freq: Every day | ORAL | Status: DC
  Administered 2017-02-11 – 2017-02-12 (×2): 325 mg via ORAL
  Filled 2017-02-10 (×3): qty 1

## 2017-02-10 MED ORDER — ONDANSETRON HCL 4 MG PO TABS: 4.0000 mg | ORAL_TABLET | ORAL | Status: DC | PRN

## 2017-02-10 MED ORDER — SODIUM CHLORIDE 0.9 % IV SOLN
INTRAVENOUS | Status: DC | PRN
  Administered 2017-02-09 (×3): 5 mL via EPIDURAL

## 2017-02-10 MED ORDER — IBUPROFEN 600 MG PO TABS
600.0000 mg | ORAL_TABLET | Freq: Four times a day (QID) | ORAL | Status: DC
  Administered 2017-02-10 – 2017-02-11 (×4): 600 mg via ORAL
  Filled 2017-02-10 (×4): qty 1

## 2017-02-10 MED ORDER — SODIUM CHLORIDE 0.9% FLUSH: 3.0000 mL | INTRAVENOUS | Status: DC | PRN

## 2017-02-10 MED ORDER — NALOXONE HCL 0.4 MG/ML IJ SOLN: 0.4000 mg | INTRAMUSCULAR | Status: DC | PRN

## 2017-02-10 MED ORDER — FENTANYL 2.5 MCG/ML W/ROPIVACAINE 0.2% IN NS 100 ML EPIDURAL INFUSION (ARMC-ANES): 10.0000 mL/h | EPIDURAL | Status: DC

## 2017-02-10 MED ORDER — ONDANSETRON HCL 4 MG/2ML IJ SOLN: 4.0000 mg | INTRAMUSCULAR | Status: DC | PRN

## 2017-02-10 MED ORDER — OXYTOCIN 10 UNIT/ML IJ SOLN
INTRAMUSCULAR | Status: AC
  Filled 2017-02-10: qty 2

## 2017-02-10 MED ORDER — FENTANYL 2.5 MCG/ML W/ROPIVACAINE 0.2% IN NS 100 ML EPIDURAL INFUSION (ARMC-ANES)
EPIDURAL | Status: DC | PRN
  Administered 2017-02-09: 10 mL/h via EPIDURAL

## 2017-02-10 MED ORDER — ONDANSETRON HCL 4 MG/2ML IJ SOLN: 4.0000 mg | Freq: Three times a day (TID) | INTRAMUSCULAR | Status: DC | PRN

## 2017-02-10 MED ORDER — HYDROCODONE-ACETAMINOPHEN 5-325 MG PO TABS
1.0000 | ORAL_TABLET | ORAL | Status: DC | PRN
  Administered 2017-02-10 – 2017-02-11 (×5): 1 via ORAL
  Filled 2017-02-10 (×5): qty 1

## 2017-02-10 NOTE — Anesthesia Preprocedure Evaluation (Addendum)
Anesthesia Evaluation  Patient identified by MRN, date of birth, ID band Patient awake    Reviewed: Allergy & Precautions, NPO status , Patient's Chart, lab work & pertinent test results  History of Anesthesia Complications Negative for: history of anesthetic complications  Airway Mallampati: II  TM Distance: >3 FB Neck ROM: Full    Dental no notable dental hx.    Pulmonary asthma (mild intermittent) , neg sleep apnea, neg COPD,    breath sounds clear to auscultation- rhonchi (-) wheezing      Cardiovascular Exercise Tolerance: Good (-) hypertension(-) CAD and (-) Past MI  Rhythm:Regular Rate:Normal - Systolic murmurs and - Diastolic murmurs    Neuro/Psych negative neurological ROS  negative psych ROS   GI/Hepatic negative GI ROS, Neg liver ROS,   Endo/Other  negative endocrine ROSneg diabetes  Renal/GU negative Renal ROS     Musculoskeletal negative musculoskeletal ROS (+)   Abdominal Gravid abdomen   Peds  Hematology negative hematology ROS (+)   Anesthesia Other Findings   Reproductive/Obstetrics (+) Pregnancy                             Anesthesia Physical Anesthesia Plan  ASA: II  Anesthesia Plan: Epidural   Post-op Pain Management:    Induction:   Airway Management Planned:   Additional Equipment:   Intra-op Plan:   Post-operative Plan:   Informed Consent: I have reviewed the patients History and Physical, chart, labs and discussed the procedure including the risks, benefits and alternatives for the proposed anesthesia with the patient or authorized representative who has indicated his/her understanding and acceptance.     Plan Discussed with: Anesthesiologist  Anesthesia Plan Comments: (Plan for epidural for labor, discussed epidural vs spinal vs GA if need for csection)        Lab Results  Component Value Date   WBC 15.5 (H) 02/09/2017   HGB 12.4  02/09/2017   HCT 35.3 02/09/2017   MCV 85.9 02/09/2017   PLT 185 02/09/2017    Anesthesia Quick Evaluation

## 2017-02-11 LAB — CBC
HCT: 32.7 % — ABNORMAL LOW (ref 35.0–47.0)
HEMOGLOBIN: 11.2 g/dL — AB (ref 12.0–16.0)
MCH: 29.8 pg (ref 26.0–34.0)
MCHC: 34.4 g/dL (ref 32.0–36.0)
MCV: 86.8 fL (ref 80.0–100.0)
Platelets: 183 10*3/uL (ref 150–440)
RBC: 3.76 MIL/uL — AB (ref 3.80–5.20)
RDW: 14.8 % — ABNORMAL HIGH (ref 11.5–14.5)
WBC: 15.5 10*3/uL — AB (ref 3.6–11.0)

## 2017-02-11 MED ORDER — DOCUSATE SODIUM 100 MG PO CAPS: 100.0000 mg | ORAL_CAPSULE | Freq: Two times a day (BID) | ORAL | 2 refills | Status: DC | PRN

## 2017-02-11 MED ORDER — IBUPROFEN 800 MG PO TABS
800.0000 mg | ORAL_TABLET | Freq: Three times a day (TID) | ORAL | Status: DC | PRN
  Administered 2017-02-11 (×2): 800 mg via ORAL
  Filled 2017-02-11 (×2): qty 1

## 2017-02-11 MED ORDER — IBUPROFEN 600 MG PO TABS: 600.0000 mg | ORAL_TABLET | Freq: Four times a day (QID) | ORAL | Status: DC

## 2017-02-11 MED ORDER — IBUPROFEN 800 MG PO TABS: 800.0000 mg | ORAL_TABLET | Freq: Three times a day (TID) | ORAL | 1 refills | Status: AC | PRN

## 2017-02-11 NOTE — Discharge Summary (Signed)
Obstetric Discharge Summary Reason for Admission: rupture of membranes Prenatal Procedures: ultrasound Intrapartum Procedures: spontaneous vaginal delivery Postpartum Procedures: none Complications-Operative and Postpartum: 2nd degree perineal laceration   Hemoglobin  Date Value Ref Range Status  02/11/2017 11.2 (L) 12.0 - 16.0 g/dL Final   HCT  Date Value Ref Range Status  02/11/2017 32.7 (L) 35.0 - 47.0 % Final   Hematocrit  Date Value Ref Range Status  10/23/2016 34.9 34.0 - 46.6 % Final    Physical Exam:  Blood pressure 125/73, pulse 90, temperature 98 F (36.7 C), temperature source Oral, resp. rate 18, height 5\' 7"  (1.702 m), weight 206 lb (93.4 kg), last menstrual period 06/17/2016, SpO2 100 %, unknown if currently breastfeeding.  General: alert and no distress Lochia: appropriate Uterine Fundus: firm Incision: None DVT Evaluation: No evidence of DVT seen on physical exam. Negative Homan's sign. No cords or calf tenderness. No significant calf/ankle edema.  Discharge Diagnoses: Term Pregnancy-delivered and Post-date pregnancy  Discharge Information: Date: 02/12/2017 Activity: pelvic rest Diet: routine Medications: PNV, Ibuprofen and Colace Condition: stable Instructions: refer to practice specific booklet Discharge to: home   Newborn Data: Live born female  Birth Weight: 8 lb 3.2 oz (3720 g) APGAR: 8, 9  Home with mother.  Paige Rodriguez 02/12/2017, 8:15 AM

## 2017-02-11 NOTE — Lactation Note (Signed)
This note was copied from a baby's chart. Lactation Consultation Note  Patient Name: Girl Gwynn BurlySierra Ohlrich ZOXWR'UToday's Date: 02/11/2017 Reason for consult: Follow-up assessment Mom c/o fussy baby "all night" Baby was crying loudly and rooting when I arrived at first session this morning. I offered help with feedings and she declined at first stating baby "just needed a diaper change and had already fed". Mom had flat affect and avoided eye contact at that time. I suggested that if after changing diaper baby was still rooting to please call me so I could assess feeding. 15 minutes later, a family member called me in to help her as baby was still rooting and crying hard. Mom has bruises on left areola, in particular and cracking and scabs from bleeding on nipples. I let her know that the trauma she has is not what she should expect with proper position and latch and why poor feeds would make her baby fussy.  Baby's suck, tongue and palate all WNL. I spent much time gently teaching her football and cross cradle hold; how to keep her fingers off areola to allow baby to latch deeply; breast compression to help with milk flow and stimulation of sleepy baby in skin to skin hold to prevent her from falling asleep before she is done nursing well. We discussed signs of a well fed baby (showed her page 21 in BF book) and helped her listen for swallows of which we heard most of 15 minutes. Even though Mom seemed reluctant to learn/try new suggestions, she started practicing them by the end of the day. Baby has become more restful after feeds. Mom was encouraged to nap after feeding baby. Lots of encouragement given. Comfort gels and coconut oil given to help with sore nipples/areola.   Maternal Data    Feeding Feeding Type: Breast Fed  LATCH Score/Interventions Latch: Grasps breast easily, tongue down, lips flanged, rhythmical sucking. Intervention(s): Adjust position;Assist with latch;Breast compression  Audible  Swallowing: Spontaneous and intermittent Intervention(s): Hand expression  Type of Nipple: Everted at rest and after stimulation  Comfort (Breast/Nipple): Engorged, cracked, bleeding, large blisters, severe discomfort Problem noted: Cracked, bleeding, blisters, bruises Intervention(s): Expressed breast milk to nipple (corrected position and latch; Comfort gels and coc. oil)     Hold (Positioning): Assistance needed to correctly position infant at breast and maintain latch. Intervention(s): Support Pillows  LATCH Score: 7  Lactation Tools Discussed/Used     Consult Status Consult Status: Follow-up Date: 02/12/17 Follow-up type: In-patient    Sunday CornSandra Clark Tresha Muzio 02/11/2017, 4:05 PM

## 2017-02-11 NOTE — Anesthesia Postprocedure Evaluation (Signed)
Anesthesia Post Note  Patient: Paige BurlySierra Boulanger  Procedure(s) Performed: * No procedures listed *  Patient location during evaluation: Mother Baby Anesthesia Type: Epidural Level of consciousness: awake, awake and alert and oriented Pain management: pain level controlled Vital Signs Assessment: post-procedure vital signs reviewed and stable Respiratory status: spontaneous breathing, nonlabored ventilation and respiratory function stable Cardiovascular status: stable Postop Assessment: no headache and no backache Anesthetic complications: no     Last Vitals:  Vitals:   02/11/17 0317 02/11/17 0732  BP: (!) 114/57 110/65  Pulse: 86 82  Resp: 19 20  Temp: 36.5 C 37.1 C    Last Pain:  Vitals:   02/11/17 0732  TempSrc: Oral  PainSc:                  Lyn RecordsNoles,  Edi Gorniak R

## 2017-02-11 NOTE — Discharge Instructions (Signed)

## 2017-02-11 NOTE — Progress Notes (Signed)
Post Partum Day # 1, s/p SVD  Subjective: no complaints, up ad lib, voiding and tolerating PO. Breastfeeding without difficulty.   Objective: Temp:  [97.7 F (36.5 C)-98.7 F (37.1 C)] 98.7 F (37.1 C) (02/28 0732) Pulse Rate:  [82-100] 82 (02/28 0732) Resp:  [18-20] 20 (02/28 0732) BP: (107-130)/(57-80) 110/65 (02/28 0732) SpO2:  [98 %-100 %] 99 % (02/28 0317)  Physical Exam:  General: alert and no distress  Lungs: clear to auscultation bilaterally Breasts: normal appearance, no masses or tenderness Heart: regular rate and rhythm, S1, S2 normal, no murmur, click, rub or gallop Pelvis: Lochia: appropriate, Uterine Fundus: firm Extremities: DVT Evaluation: No evidence of DVT seen on physical exam. Negative Homan's sign. No cords or calf tenderness. No significant calf/ankle edema.   Recent Labs  02/09/17 1613 02/11/17 0617  HGB 12.4 11.2*  HCT 35.3 32.7*    Assessment/Plan: Doing well postpartum.  Breastfeeding, s/p Lactation consult  Contraception desired is Nexplanon.  Plan for discharge tomorrow   LOS: 2 days   Hildred LaserAnika Lizvette Lightsey, MD [facility]

## 2017-02-12 NOTE — Progress Notes (Signed)
Post Partum Day # 2, s/p SVD  Subjective: no complaints, up ad lib, voiding and tolerating PO. Breastfeeding without difficulty.   Objective: Vitals:   02/11/17 0732 02/11/17 1158 02/11/17 1919 02/12/17 0753  BP: 110/65 113/70 124/74 125/73  Pulse: 82 96 87 90  Resp: 20 20 20 18   Temp: 98.7 F (37.1 C) 97.9 F (36.6 C) 98.7 F (37.1 C) 98 F (36.7 C)  TempSrc: Oral Oral Oral Oral  SpO2:    100%  Weight:      Height:        Physical Exam:  General: alert and no distress  Lungs: clear to auscultation bilaterally Breasts: normal appearance, no masses or tenderness Heart: regular rate and rhythm, S1, S2 normal, no murmur, click, rub or gallop Pelvis: Lochia: appropriate, Uterine Fundus: firm Extremities: DVT Evaluation: No evidence of DVT seen on physical exam. Negative Homan's sign. No cords or calf tenderness. No significant calf/ankle edema.   Recent Labs  02/09/17 1613 02/11/17 0617  HGB 12.4 11.2*  HCT 35.3 32.7*    Assessment/Plan: Doing well postpartum.  Breastfeeding, doing well  Contraception desired is Nexplanon. To have insertion in 6 weeks.  Plan for discharge today.   LOS: 3 days   Hildred LaserAnika Benjaman Artman, MD [facility]

## 2017-02-16 ENCOUNTER — Emergency Department
Admission: EM | Admit: 2017-02-16 | Discharge: 2017-02-16 | Disposition: A | Discharge status: Home / Self Care | Payer: Medicaid Other | Attending: Emergency Medicine | Admitting: Emergency Medicine

## 2017-02-16 ENCOUNTER — Emergency Department: Payer: Medicaid Other

## 2017-02-16 DIAGNOSIS — R102 Pelvic and perineal pain: Secondary | ICD-10-CM | POA: Insufficient documentation

## 2017-02-16 DIAGNOSIS — B958 Unspecified staphylococcus as the cause of diseases classified elsewhere: Secondary | ICD-10-CM | POA: Insufficient documentation

## 2017-02-16 DIAGNOSIS — O8620 Urinary tract infection following delivery, unspecified: Secondary | ICD-10-CM | POA: Diagnosis not present

## 2017-02-16 DIAGNOSIS — N390 Urinary tract infection, site not specified: Secondary | ICD-10-CM

## 2017-02-16 DIAGNOSIS — R52 Pain, unspecified: Secondary | ICD-10-CM

## 2017-02-16 LAB — URINALYSIS, COMPLETE (UACMP) WITH MICROSCOPIC
BACTERIA UA: NONE SEEN
BILIRUBIN URINE: NEGATIVE
GLUCOSE, UA: NEGATIVE mg/dL
Ketones, ur: NEGATIVE mg/dL
NITRITE: NEGATIVE
Protein, ur: NEGATIVE mg/dL
Specific Gravity, Urine: 1.018 (ref 1.005–1.030)
pH: 6 (ref 5.0–8.0)

## 2017-02-16 LAB — COMPREHENSIVE METABOLIC PANEL
ALBUMIN: 3.3 g/dL — AB (ref 3.5–5.0)
ALK PHOS: 133 U/L — AB (ref 38–126)
ALT: 26 U/L (ref 14–54)
ANION GAP: 11 (ref 5–15)
AST: 27 U/L (ref 15–41)
BILIRUBIN TOTAL: 0.5 mg/dL (ref 0.3–1.2)
BUN: 18 mg/dL (ref 6–20)
CALCIUM: 9.4 mg/dL (ref 8.9–10.3)
CO2: 25 mmol/L (ref 22–32)
CREATININE: 0.65 mg/dL (ref 0.44–1.00)
Chloride: 102 mmol/L (ref 101–111)
GFR calc Af Amer: >60 mL/min (ref >60)
GFR calc non Af Amer: >60 mL/min (ref >60)
GLUCOSE: 96 mg/dL (ref 65–99)
Potassium: 3.8 mmol/L (ref 3.5–5.1)
Sodium: 138 mmol/L (ref 135–145)
TOTAL PROTEIN: 7.2 g/dL (ref 6.5–8.1)

## 2017-02-16 LAB — CBC
HCT: 40 % (ref 35.0–47.0)
HEMOGLOBIN: 13.6 g/dL (ref 12.0–16.0)
MCH: 29.5 pg (ref 26.0–34.0)
MCHC: 34.1 g/dL (ref 32.0–36.0)
MCV: 86.6 fL (ref 80.0–100.0)
PLATELETS: 325 10*3/uL (ref 150–440)
RBC: 4.61 MIL/uL (ref 3.80–5.20)
RDW: 13.8 % (ref 11.5–14.5)
WBC: 14.3 10*3/uL — ABNORMAL HIGH (ref 3.6–11.0)

## 2017-02-16 LAB — HCG, QUANTITATIVE, PREGNANCY: HCG, BETA CHAIN, QUANT, S: 88 m[IU]/mL — AB (ref <5)

## 2017-02-16 MED ORDER — CEPHALEXIN 500 MG PO CAPS
500.0000 mg | ORAL_CAPSULE | Freq: Once | ORAL | Status: AC
  Administered 2017-02-16: 500 mg via ORAL
  Filled 2017-02-16: qty 1

## 2017-02-16 MED ORDER — CEPHALEXIN 500 MG PO CAPS: 500.0000 mg | ORAL_CAPSULE | Freq: Three times a day (TID) | ORAL | 0 refills | Status: DC

## 2017-02-16 NOTE — ED Notes (Signed)
Patient reports giving birth 02/10/17. Pt c/o pelvic/lower abdominal cramping. Pt denies passing large clots. Pt denies vaginal discharge. Pt denies fever/chills.

## 2017-02-16 NOTE — ED Notes (Signed)
Pt updated on delay. Pt verbalizes understanding. Pt denies increased vaginal bleeding. Skin pwd, resps unlabored.

## 2017-02-16 NOTE — ED Triage Notes (Signed)
Pt to triage via wheelchair. Pt reports she is 5 days post partum and has been having normal post delivery abd cramping until about an hour ago when she developed worse abd pain. Pt reports normal amount of postpartum bleeding and denies other sx.

## 2017-02-16 NOTE — ED Notes (Signed)
Patient reports she took 800 mg ibuprofen at approx 2330 last night

## 2017-02-16 NOTE — ED Provider Notes (Signed)
[facility] Emergency Department Provider Note   ____________________________________________   First MD Initiated Contact with Patient 02/16/17 [phone]     (approximate)  I have reviewed the triage vital signs and the nursing notes.   HISTORY  Chief Complaint Abdominal Pain and Postpartum Complications    HPI Paige Rodriguez is a 21 y.o. female who presents to the ED from home with a chief complaint of abdominal cramping. Patient is 5 days postpartum status post full-term NSVD. Reports abdominal cramping this evening. States vaginal bleeding has not increased. Denies fever, chills, chest pain, shortness of breath, nausea, vomiting, diarrhea. Patient is not breast feeding. Nothing makes her symptoms better or worse.   Past Medical History:  Diagnosis Date  . History of recurrent UTIs    age 89-7, had surgery to correct on renal tubes    Patient Active Problem List   Diagnosis Date Noted  . PROM (premature rupture of membranes) 02/09/2017  . Post-dates pregnancy 02/09/2017  . Supervision of normal pregnancy in third trimester 01/14/2017  . Insufficient prenatal care in second trimester 10/27/2016    Past Surgical History:  Procedure Laterality Date  . KIDNEY SURGERY     had surgery on renal tubules for h/o recurrent UTIs    Prior to Admission medications   Medication Sig Start Date End Date Taking? Authorizing Provider  docusate sodium (COLACE) 100 MG capsule Take 1 capsule (100 mg total) by mouth 2 (two) times daily as needed. 02/11/17   Hildred LaserAnika Cherry, MD  ibuprofen (ADVIL,MOTRIN) 800 MG tablet Take 1 tablet (800 mg total) by mouth every 8 (eight) hours as needed. 02/11/17   Hildred LaserAnika Cherry, MD  Prenat-FeFmCb-DSS-FA-DHA w/o A (CITRANATAL HARMONY) 27-1-260 MG CAPS Take 27 mg by mouth daily. 10/23/16   Hildred LaserAnika Cherry, MD    Allergies Patient has no known allergies.  No family history on file.  Social History Social History  Substance Use Topics  .  Smoking status: Never Smoker  . Smokeless tobacco: Never Used  . Alcohol use No    Review of Systems  Constitutional: No fever/chills. Eyes: No visual changes. ENT: No sore throat. Cardiovascular: Denies chest pain. Respiratory: Denies shortness of breath. Gastrointestinal: Positive for pelvic cramping. No abdominal pain.  No nausea, no vomiting.  No diarrhea.  No constipation. Genitourinary: Positive for postpartum vaginal bleeding. Negative for dysuria. Musculoskeletal: Negative for back pain. Skin: Negative for rash. Neurological: Negative for headaches, focal weakness or numbness.  10-point ROS otherwise negative.  ____________________________________________   PHYSICAL EXAM:  VITAL SIGNS: ED Triage Vitals  Enc Vitals Group     BP 02/16/17 0055 127/80     Pulse Rate 02/16/17 0055 79     Resp 02/16/17 0055 18     Temp 02/16/17 0055 98.1 F (36.7 C)     Temp Source 02/16/17 0055 Oral     SpO2 02/16/17 0055 97 %     Weight 02/16/17 0056 200 lb (90.7 kg)     Height 02/16/17 0056 5\' 7"  (1.702 m)     Head Circumference --      Peak Flow --      Pain Score 02/16/17 0057 4     Pain Loc --      Pain Edu? --      Excl. in GC? --     Constitutional: Alert and oriented. Well appearing and in no acute distress. Eyes: Conjunctivae are normal. PERRL. EOMI. Head: Atraumatic. Nose: No congestion/rhinnorhea. Mouth/Throat: Mucous membranes are moist.  Oropharynx  non-erythematous. Neck: No stridor.   Cardiovascular: Normal rate, regular rhythm. Grossly normal heart sounds.  Good peripheral circulation. Respiratory: Normal respiratory effort.  No retractions. Lungs CTAB. Gastrointestinal: Soft and minimally tender to palpation lower abdomen without rebound or guarding. No distention. No abdominal bruits. No CVA tenderness. Musculoskeletal: No lower extremity tenderness nor edema.  No joint effusions. Neurologic:  Normal speech and language. No gross focal neurologic deficits are  appreciated. No gait instability. Skin:  Skin is warm, dry and intact. No rash noted. Psychiatric: Mood and affect are normal. Speech and behavior are normal.  ____________________________________________   LABS (all labs ordered are listed, but only abnormal results are displayed)  Labs Reviewed  COMPREHENSIVE METABOLIC PANEL - Abnormal; Notable for the following:       Result Value   Albumin 3.3 (*)    Alkaline Phosphatase 133 (*)    All other components within normal limits  CBC - Abnormal; Notable for the following:    WBC 14.3 (*)    All other components within normal limits  URINALYSIS, COMPLETE (UACMP) WITH MICROSCOPIC - Abnormal; Notable for the following:    Color, Urine YELLOW (*)    APPearance HAZY (*)    Hgb urine dipstick MODERATE (*)    Leukocytes, UA SMALL (*)    Squamous Epithelial / LPF 0-5 (*)    All other components within normal limits  HCG, QUANTITATIVE, PREGNANCY - Abnormal; Notable for the following:    hCG, Beta Chain, Quant, S 88 (*)    All other components within normal limits   ____________________________________________  EKG  None ____________________________________________  RADIOLOGY  US Pelvis interpreted per Dr. Chestine Spore: Enlarged uterus due to recent delivery. Thickened endometrium which  is irregular hyperechoic. Cannot exclude retained products of  conception. No focal fluid collection.   ____________________________________________   PROCEDURES  Procedure(s) performed:   Pelvic exam: External exam WNL without rashes, lesions or vesicles. Sutures in place. Speculum exam reveals mild vaginal bleeding. Cervical os closed. Bimanual exam WNL.  Procedures  Critical Care performed: No  ____________________________________________   INITIAL IMPRESSION / ASSESSMENT AND PLAN / ED COURSE  Pertinent labs & imaging results that were available during my care of the patient were reviewed by me and considered in my medical decision making  (see chart for details).  21 year old female who is 5 days postpartum presents with abdominal cramping. Laboratory urinalysis results remarkable for mild leukocytosis and UTI. Will proceed with pelvic ultrasound to evaluate for retained products of conception.  Clinical Course as of Feb 17 731  Sheral Flow Feb 16, 2017  0720 Updated patient and family member of [place] results. Discussed with Dr. Logan Bores (OB/GYN) who recommends follow-up in office this week. Strict return precautions given. Patient verbalizes understanding and agrees with plan of care.   [JS]    Clinical Course User Index [JS] Irean Hong, MD     ____________________________________________   FINAL CLINICAL IMPRESSION(S) / ED DIAGNOSES  Final diagnoses:  Pain  Pelvic pain in female  Lower urinary tract infectious disease      NEW MEDICATIONS STARTED DURING THIS VISIT:  New Prescriptions   No medications on file     Note:  This document was prepared using Dragon voice recognition software and may include unintentional dictation errors.    Irean Hong, MD 02/16/17 [phone]

## 2017-02-16 NOTE — Discharge Instructions (Signed)
1. Take antibiotic as prescribed (Keflex 500mg  three times daily x 7 days). 2. Return to the ER for worsening symptoms, heavy bleeding (soaking more than 1 pad per hour), persistent vomiting, difficulty breathing or other concerns.

## 2017-02-16 NOTE — ED Notes (Signed)
Patient returned from ultrasound.

## 2017-02-16 NOTE — ED Notes (Signed)
Patient transported to Ultrasound 

## 2017-02-18 ENCOUNTER — Ambulatory Visit (INDEPENDENT_AMBULATORY_CARE_PROVIDER_SITE_OTHER): Payer: Medicaid Other | Admitting: Obstetrics and Gynecology

## 2017-02-18 DIAGNOSIS — N3000 Acute cystitis without hematuria: Secondary | ICD-10-CM

## 2017-02-18 DIAGNOSIS — R102 Pelvic and perineal pain: Secondary | ICD-10-CM

## 2017-02-18 NOTE — Progress Notes (Signed)
GYNECOLOGY PROGRESS NOTE  Subjective:    Patient ID: Paige Rodriguez, female    DOB: Nov 12, 1996, 21 y.o.   MRN: [id number]  HPI  Patient is a 21 y.o. G75P1001 female who presents for fu of ER visit.  Patient is PPD #8 s/p NSVD, who was seen 3 days ago in the ER due to pelvic pain and cramping, ,radiating to right side. Was also noting low grade fever. Underwent workup which revealed a UTI.  Ultrasound also noted that they "could not r/o possible retained products".  Patient was initiated on Keflex for UTI symptoms.  Notes that since being on the antibiotics she has felt better. Of note, patient has a h/o recurrent UTIs. Denies chills, nausea/vomiting, foul smelling vaginal discharge or increase in vaginal bleeding.   The following portions of the patient's history were reviewed and updated as appropriate: allergies, current medications, past family history, past medical history, past social history, past surgical history and problem list.  Review of Systems Pertinent items noted in HPI and remainder of comprehensive ROS otherwise negative.   Objective:   Blood pressure 116/63, pulse (!) 102, height 5' 7"  (1.702 m), weight 189 lb 12.8 oz (86.1 kg), currently breastfeeding. General appearance: alert and no distress Abdomen: soft, non-tender; bowel sounds normal; no masses,  no organomegaly Pelvic: deferred    Labs:  Admission on 02/16/2017, Discharged on 02/16/2017  Component Date Value Ref Range Status  . Sodium 02/16/2017 138  135 - 145 mmol/L Final  . Potassium 02/16/2017 3.8  3.5 - 5.1 mmol/L Final  . Chloride 02/16/2017 102  101 - 111 mmol/L Final  . CO2 02/16/2017 25  22 - 32 mmol/L Final  . Glucose, Bld 02/16/2017 96  65 - 99 mg/dL Final  . BUN 02/16/2017 18  6 - 20 mg/dL Final  . Creatinine, Ser 02/16/2017 0.65  0.44 - 1.00 mg/dL Final  . Calcium 02/16/2017 9.4  8.9 - 10.3 mg/dL Final  . Total Protein 02/16/2017 7.2  6.5 - 8.1 g/dL Final  . Albumin 02/16/2017 3.3* 3.5 - 5.0  g/dL Final  . AST 02/16/2017 27  15 - 41 U/L Final  . ALT 02/16/2017 26  14 - 54 U/L Final  . Alkaline Phosphatase 02/16/2017 133* 38 - 126 U/L Final  . Total Bilirubin 02/16/2017 0.5  0.3 - 1.2 mg/dL Final  . GFR calc non Af Amer 02/16/2017 >60  >60 mL/min Final  . GFR calc Af Amer 02/16/2017 >60  >60 mL/min Final   Comment: (NOTE) The eGFR has been calculated using the CKD EPI equation. This calculation has not been validated in all clinical situations. eGFR's persistently <60 mL/min signify possible Chronic Kidney Disease.   . Anion gap 02/16/2017 11  5 - 15 Final  . WBC 02/16/2017 14.3* 3.6 - 11.0 K/uL Final  . RBC 02/16/2017 4.61  3.80 - 5.20 MIL/uL Final  . Hemoglobin 02/16/2017 13.6  12.0 - 16.0 g/dL Final  . HCT 02/16/2017 40.0  35.0 - 47.0 % Final  . MCV 02/16/2017 86.6  80.0 - 100.0 fL Final  . MCH 02/16/2017 29.5  26.0 - 34.0 pg Final  . MCHC 02/16/2017 34.1  32.0 - 36.0 g/dL Final  . RDW 02/16/2017 13.8  11.5 - 14.5 % Final  . Platelets 02/16/2017 325  150 - 440 K/uL Final  . Color, Urine 02/16/2017 YELLOW* YELLOW Final  . APPearance 02/16/2017 HAZY* CLEAR Final  . Specific Gravity, Urine 02/16/2017 1.018  1.005 - 1.030 Final  .  pH 02/16/2017 6.0  5.0 - 8.0 Final  . Glucose, UA 02/16/2017 NEGATIVE  NEGATIVE mg/dL Final  . Hgb urine dipstick 02/16/2017 MODERATE* NEGATIVE Final  . Bilirubin Urine 02/16/2017 NEGATIVE  NEGATIVE Final  . Ketones, ur 02/16/2017 NEGATIVE  NEGATIVE mg/dL Final  . Protein, ur 02/16/2017 NEGATIVE  NEGATIVE mg/dL Final  . Nitrite 02/16/2017 NEGATIVE  NEGATIVE Final  . Leukocytes, UA 02/16/2017 SMALL* NEGATIVE Final  . RBC / HPF 02/16/2017 0-5  0 - 5 RBC/hpf Final  . WBC, UA 02/16/2017 6-30  0 - 5 WBC/hpf Final  . Bacteria, UA 02/16/2017 NONE SEEN  NONE SEEN Final  . Squamous Epithelial / LPF 02/16/2017 0-5* NONE SEEN Final  . Mucous 02/16/2017 PRESENT   Final  . Amorphous Crystal 02/16/2017 PRESENT   Final  . hCG, Beta Chain, Quant, S  02/16/2017 88* <5 mIU/mL Final   Comment:          GEST. AGE      CONC.  (mIU/mL)   <=1 WEEK        5 - 50     2 WEEKS       50 - 500     3 WEEKS       100 - 10,000     4 WEEKS     1,000 - 30,000     5 WEEKS     3,500 - 115,000   6-8 WEEKS     12,000 - 270,000    12 WEEKS     15,000 - 220,000        FEMALE AND NON-PREGNANT FEMALE:     LESS THAN 5 mIU/mL      Imaging:  CLINICAL DATA:  Post partum. Evaluate retained product of conception. Delivery 02/10/2017  EXAM: TRANSABDOMINAL AND TRANSVAGINAL ULTRASOUND OF PELVIS  TECHNIQUE: Both transabdominal and transvaginal ultrasound examinations of the pelvis were performed. Transabdominal technique was performed for global imaging of the pelvis including uterus, ovaries, adnexal regions, and pelvic cul-de-sac. It was necessary to proceed with endovaginal exam following the transabdominal exam to visualize the endometrium and ovaries.  COMPARISON:  Ultrasound pelvis 01/24/2017  FINDINGS: Uterus  Measurements: 13.4 x 7.8 x 10.2 cm. No fibroids or other mass visualized.  Endometrium  Thickness: 20 mm. Endometrium is thickened and irregular and hyperechoic. Possible retained products of conception.  Right ovary  Measurements: 3.1 x 1.9 x 1.7 cm. Normal appearance/no adnexal mass.  Left ovary  Measurements: 4.3 x 1.5 x 2.0 cm. Normal appearance/no adnexal mass.  Other findings  No abnormal free fluid.  IMPRESSION: Enlarged uterus due to recent delivery. Thickened endometrium which is irregular hyperechoic. Cannot exclude retained products of conception. No focal fluid collection.  Assessment:   UTI postpartum Pelvic pain (resolved)  Plan:   - Advised to continue antibiotics for suspected UTI diagnosed in ER as her symptoms have begun to improve.  Pelvic pain likely due to this.  - Reviewed ultrasound, patient with thickened endometrium as she is recently postpartum.  Notes that it cannot r/o retained  products.  Pathology reports notes no detached fragments of placenta, patient with no other symptoms suggesting retained products.  Will continue to monitor, but this is a less likely diagnosis.  - Pelvic pain has resolved.  Can continue treating routine postpartum discomforts with prescribed Ibuprofen.  - To f/u in 4 weeks for routine postpartum care.  Given bleeding and infection precautions. Will also f/u in 5 weeks for Nexplanon insertion.    Rubie Maid, MD [facility]

## 2017-03-18 ENCOUNTER — Encounter: Payer: Medicaid Other | Admitting: Obstetrics and Gynecology

## 2017-03-24 ENCOUNTER — Ambulatory Visit (INDEPENDENT_AMBULATORY_CARE_PROVIDER_SITE_OTHER): Payer: Medicaid Other | Admitting: Obstetrics and Gynecology

## 2017-03-24 ENCOUNTER — Encounter: Payer: Self-pay | Admitting: Obstetrics and Gynecology

## 2017-03-24 DIAGNOSIS — Z30017 Encounter for initial prescription of implantable subdermal contraceptive: Secondary | ICD-10-CM

## 2017-03-24 NOTE — Progress Notes (Signed)
   OBSTETRICS POSTPARTUM CLINIC PROGRESS NOTE  Subjective:     Paige Rodriguez is a 21 y.o. G55P1001 female who presents for a postpartum visit. She is 6 weeks postpartum following a spontaneous vaginal delivery. I have fully reviewed the prenatal and intrapartum course. The delivery was at 40 gestational weeks.  Anesthesia: epidural. Postpartum course has been well. Baby's course has been well. Baby is feeding by both breast and bottle - [employer]. Bleeding: patient has resumed menses, with Patient's last menstrual period was 03/21/2017. Bowel function is normal. Bladder function is normal. Patient is not sexually active. Contraception method desired is Nexplanon. Postpartum depression screening: negative.  The following portions of the patient's history were reviewed and updated as appropriate: allergies, current medications, past family history, past medical history, past social history, past surgical history and problem list.  Review of Systems Pertinent items noted in HPI and remainder of comprehensive ROS otherwise negative.   Objective:    BP 130/80   Pulse 78   Ht  (1.702 m)   Wt 189 lb 8 oz (86 kg)   LMP 03/21/2017   Breastfeeding? No   BMI 29.68 kg/m   General:  alert and no distress   Breasts:  inspection negative, no nipple discharge or bleeding, no masses or nodularity palpable  Lungs: clear to auscultation bilaterally  Heart:  regular rate and rhythm, S1, S2 normal, no murmur, click, rub or gallop  Abdomen: soft, non-tender; bowel sounds normal; no masses,  no organomegaly.     Vulva:  normal  Vagina: normal vagina, no discharge, exudate, lesion, or erythema  Cervix:  no cervical motion tenderness and no lesions  Corpus: normal size, contour, position, consistency, mobility, non-tender  Adnexa:  normal adnexa and no mass, fullness, tenderness  Rectal Exam: Not performed.         Labs:  Lab Results  Component Value Date   HGB 13.6 02/16/2017     Assessment:     Routine postpartum exam.    Contraception management, insertion of Nexplanon.    Plan:   1. Contraception: Nexplanon.  Inserted today (see procedure note below) 2.  Follow up in: 4-6 months for annual exam, oras needed.     Nexplanon Insertion Procedure Patient identified, informed consent performed, consent signed.   Patient does understand that irregular bleeding is a very common side effect of this medication. She was advised to have backup contraception for one week after placement. Pregnancy test in clinic today was negative.  Appropriate time out taken.  Patient's left arm was prepped and draped in the usual sterile fashion. The ruler used to measure and mark insertion area.  Patient was prepped with alcohol swab and then injected with 3 ml of 1% lidocaine.  She was prepped with betadine, Nexplanon removed from packaging,  Device confirmed in needle, then inserted full length of needle and withdrawn per handbook instructions. Nexplanon was able to palpated in the patient's arm; patient palpated the insert herself. There was minimal blood loss.  Patient insertion site covered with guaze and a pressure bandage to reduce any bruising.  The patient tolerated the procedure well and was given post procedure instructions.    Hildred Laser, MD [facility]

## 2017-03-24 NOTE — Patient Instructions (Signed)
NEXPLANON PLACEMENT POST-PROCEDURE INSTRUCTIONS ° °1. You may take Ibuprofen, Aleve or Tylenol for pain if needed.  Pain should resolve within in 24 hours. ° °2. You may have intercourse after 24 hours.  If you using this for birth control, it is effective immediately. ° °3. You need to call if you have any fever, heavy bleeding, or redness at insertion site. Irregular bleeding is common the first several months after having a Nexplanonplaced. You do not need to call for this reason unless you are concerned. ° °4. Shower or bathe as normal.  You can remove the bandage after 24 hours. ° °

## 2017-08-25 ENCOUNTER — Encounter: Payer: Medicaid Other | Admitting: Obstetrics and Gynecology

## 2017-09-01 ENCOUNTER — Encounter: Payer: Medicaid Other | Admitting: Obstetrics and Gynecology

## 2018-10-02 IMAGING — US US OB LIMITED
1 series · 14 of 21 positions shown · non-contrast
Comparison: none

CLINICAL DATA: Decreased fetal movement for 1 day

EXAM:
LIMITED OBSTETRIC ULTRASOUND

[Series 1: us ob limited · 0.23mm/px · 14 of 21 slices shown]
[im 1/21]
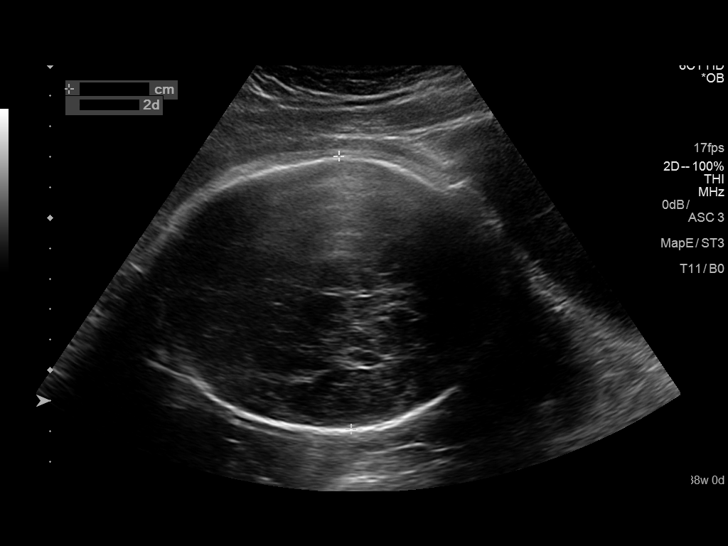
[im 3/21]
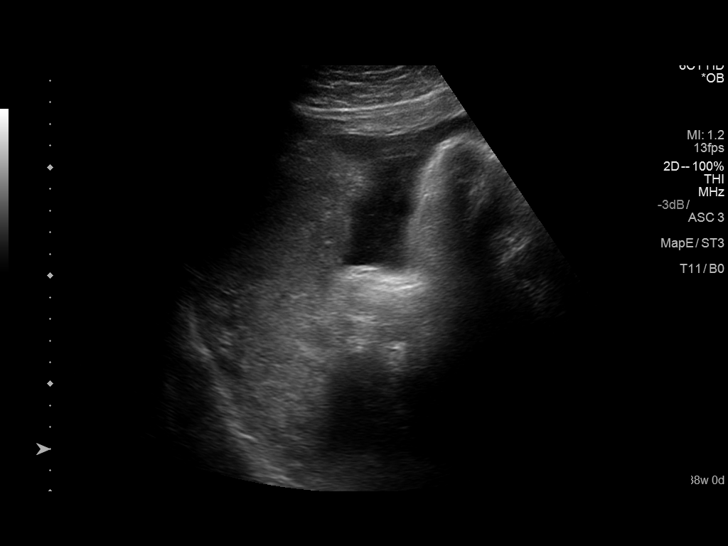
[im 4/21]
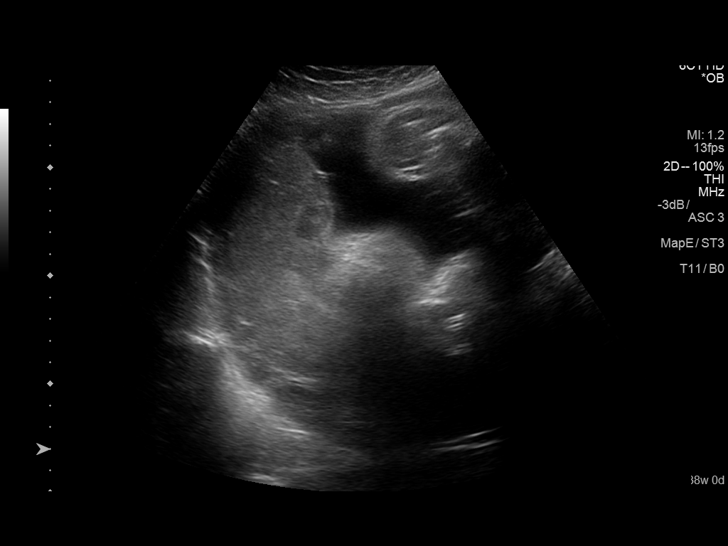
[im 6/21]
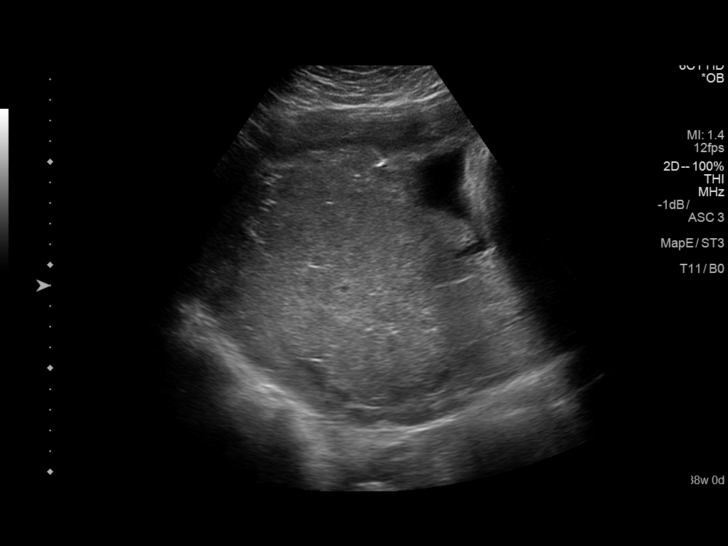
[im 7/21]
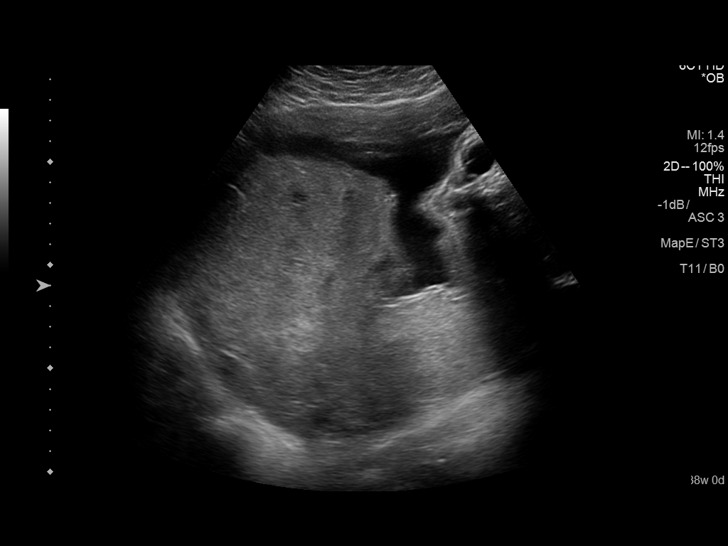
[im 9/21]
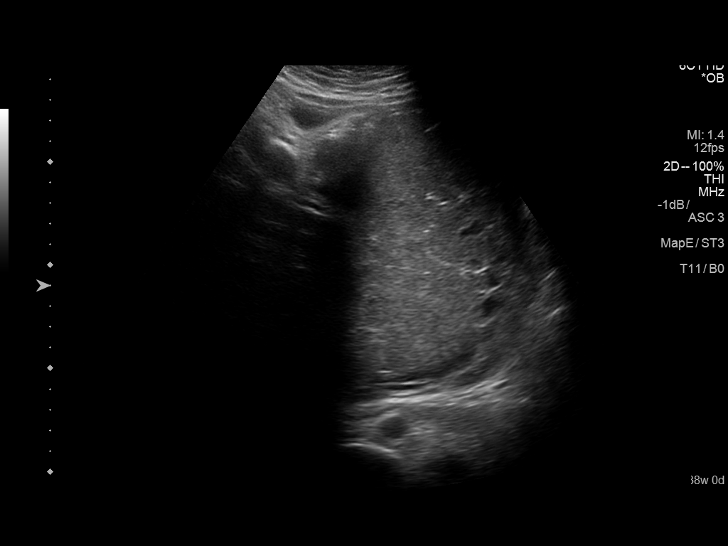
[im 10/21]
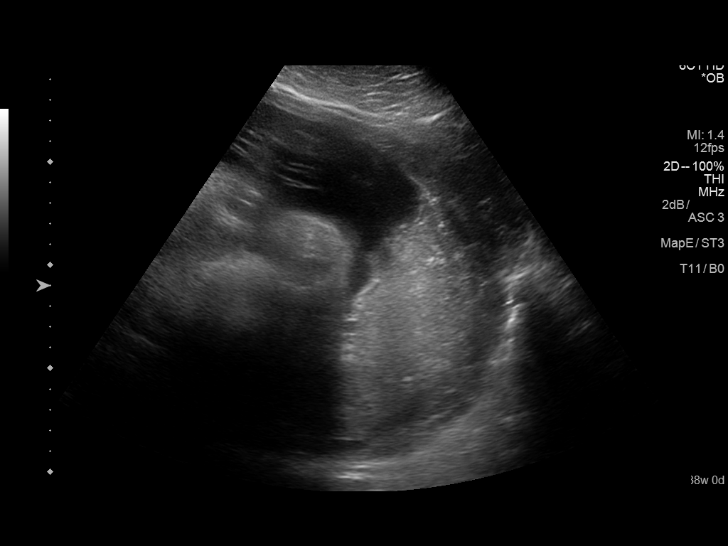
[im 12/21]
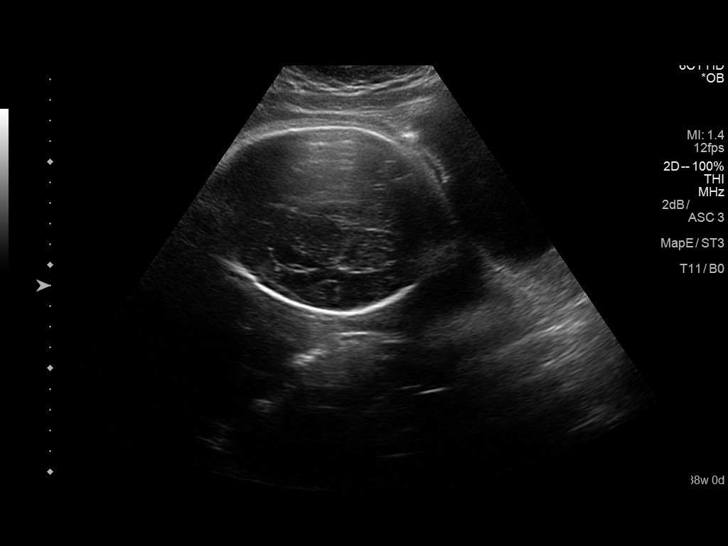
[im 13/21]
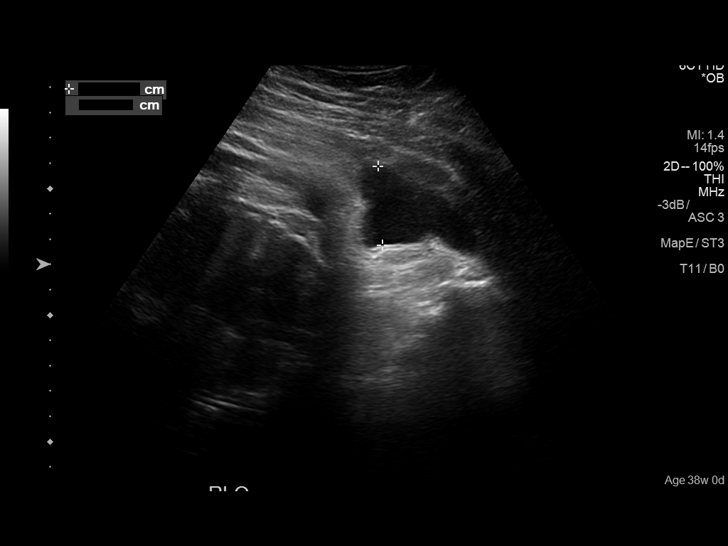
[im 15/21]
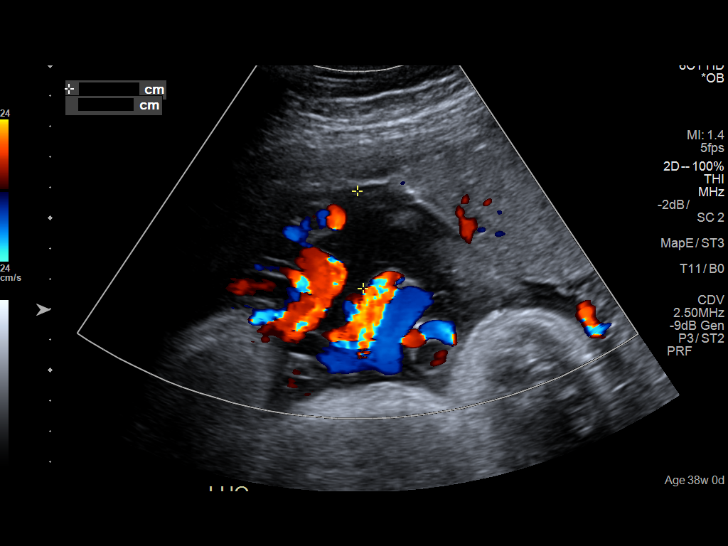
[im 16/21]
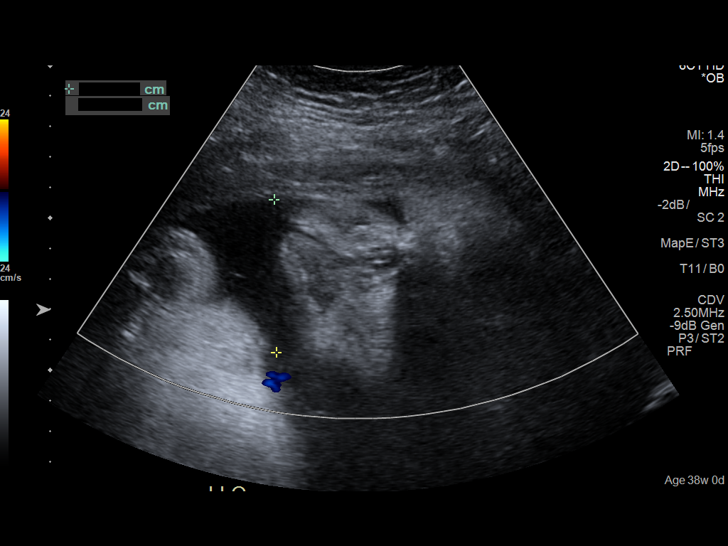
[im 18/21]
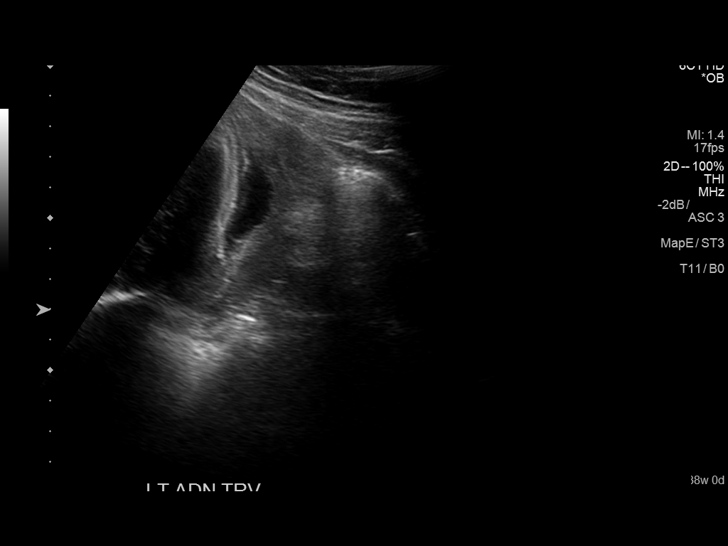
[im 19/21]
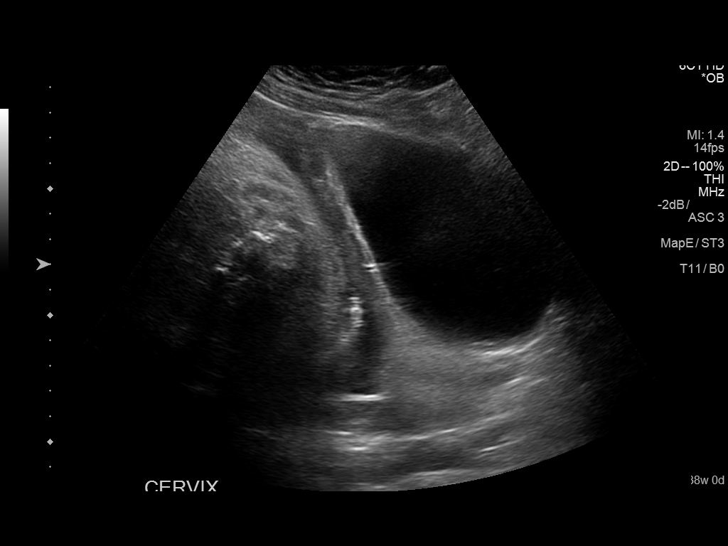
[im 21/21]
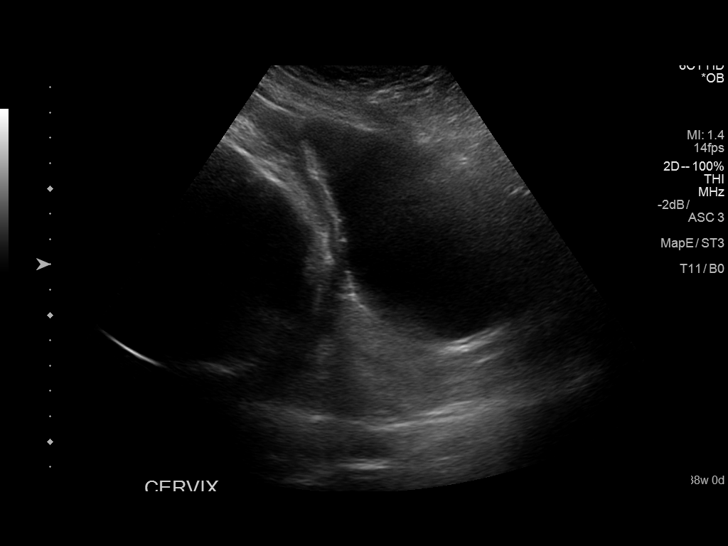

[14 of 21 positions shown; findings below may reference images not displayed]

FINDINGS: Number of Fetuses: 1

Heart Rate:  132 bpm

Movement: Yes

Presentation: Cephalic

Placental Location: Posterior

Previa: No

Amniotic Fluid (Subjective):  Within normal limits.

BPD:  8.97cm 36w  2d

MATERNAL FINDINGS:

Cervix:  Appears closed.  Normal length measuring 3.7 cm.

Uterus/Adnexae:  No abnormality visualized.
IMPRESSION: Single live intrauterine pregnancy as described above.

This exam is performed on an emergent basis and does not
comprehensively evaluate fetal size, dating, or anatomy; follow-up
complete OB US should be considered if further fetal assessment is
warranted.

## 2018-10-25 IMAGING — US US PELVIS COMPLETE
1 series · 14 of 25 positions shown · non-contrast
Comparison: Ultrasound pelvis 01/24/2017

CLINICAL DATA: Post partum. Evaluate retained product of
conception. Delivery 02/10/2017

EXAM:
TRANSABDOMINAL AND TRANSVAGINAL ULTRASOUND OF PELVIS
TECHNIQUE: Both transabdominal and transvaginal ultrasound examinations of the
pelvis were performed. Transabdominal technique was performed for
global imaging of the pelvis including uterus, ovaries, adnexal
regions, and pelvic cul-de-sac. It was necessary to proceed with
endovaginal exam following the transabdominal exam to visualize the
endometrium and ovaries.

[Series 1: us pelvis complete · 0.27mm/px · 14 of 51 slices shown]
[im 1/51]
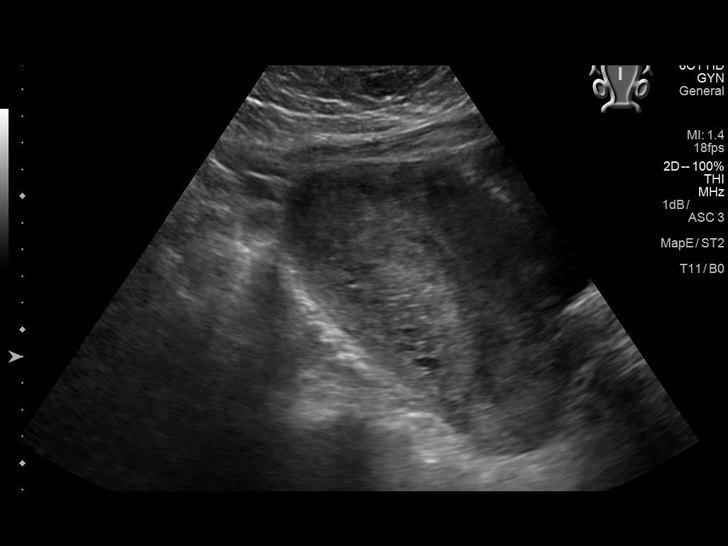
[im 5/51]
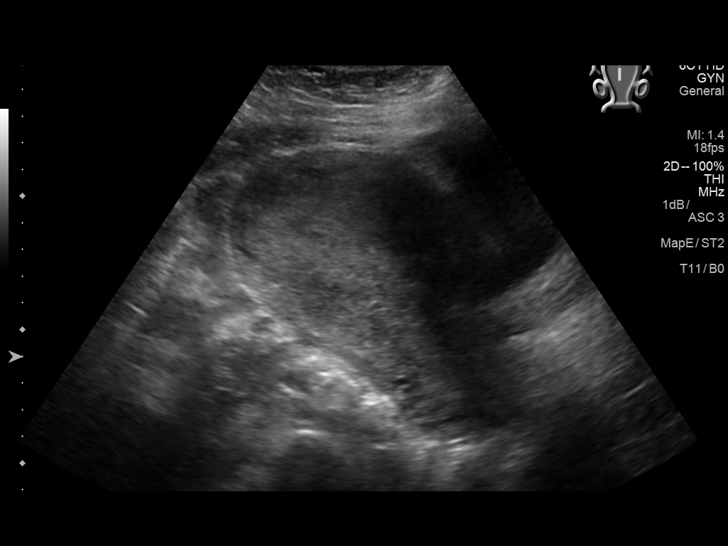
[im 9/51]
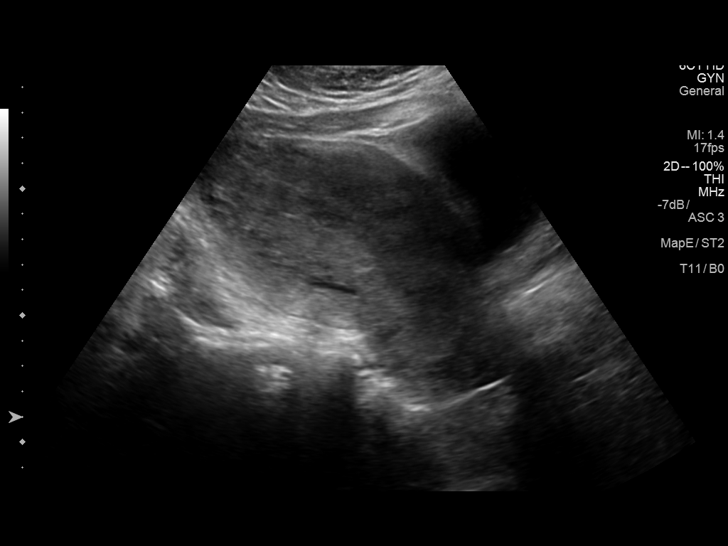
[im 13/51]
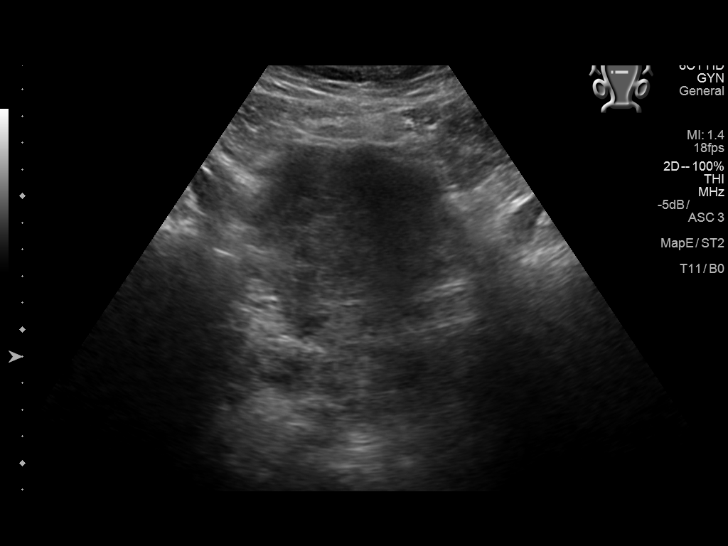
[im 17/51]
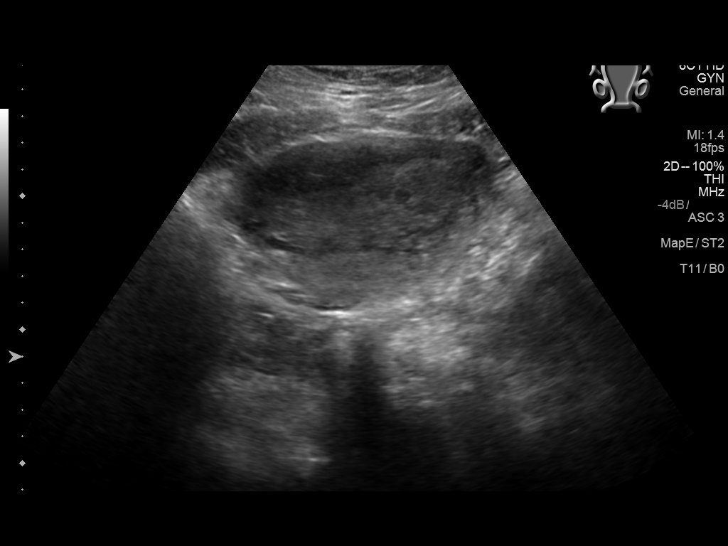
[im 19/51]
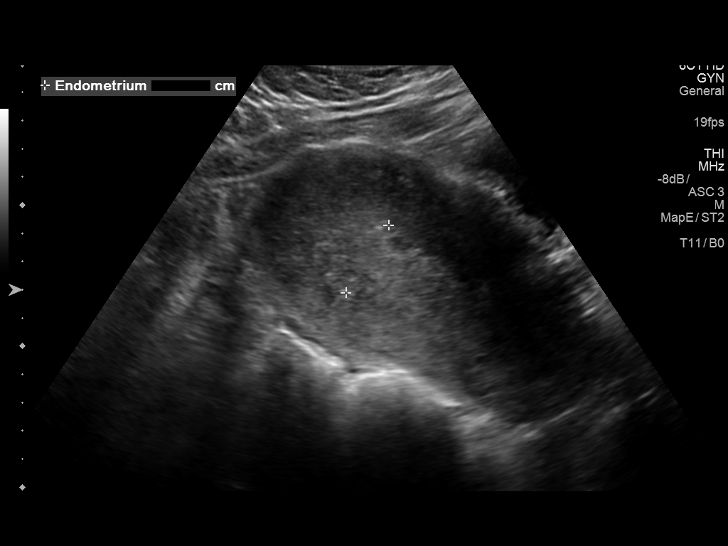
[im 23/51]
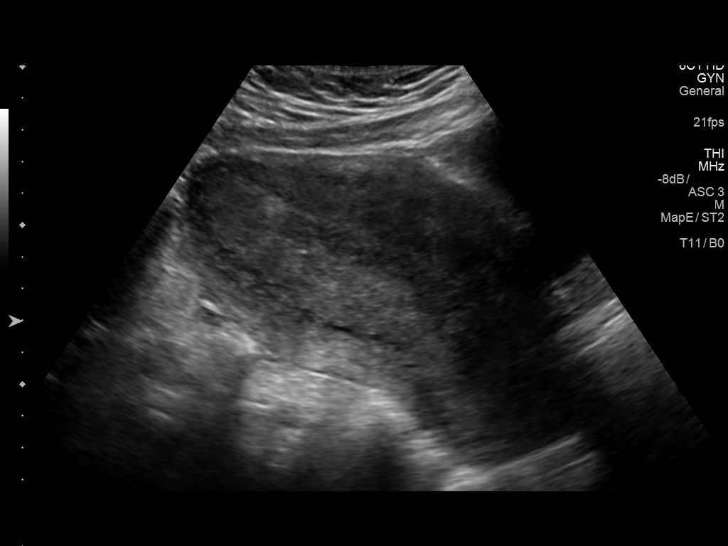
[im 28/51]
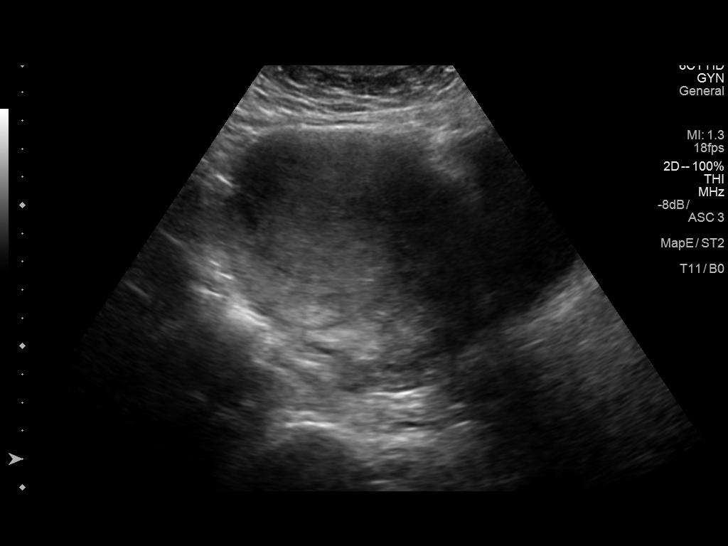
[im 32/51]
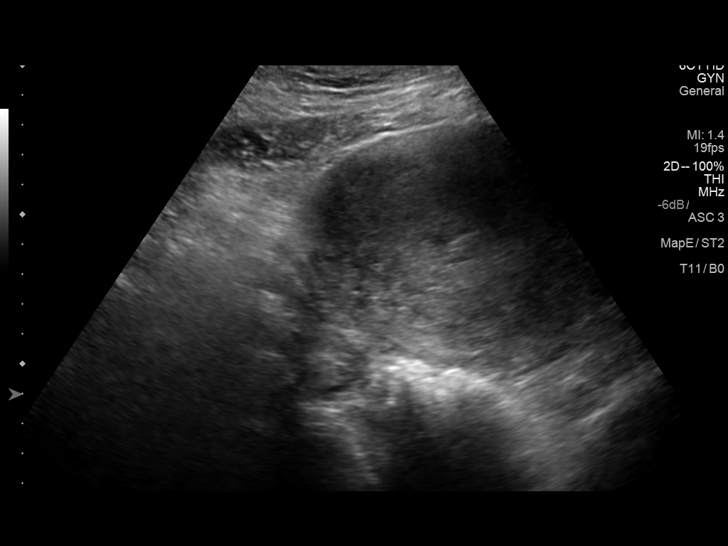
[im 34/51]
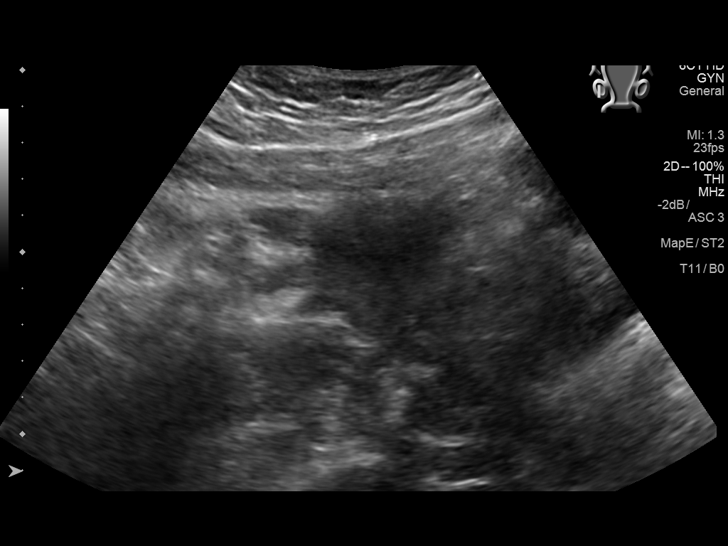
[im 38/51]
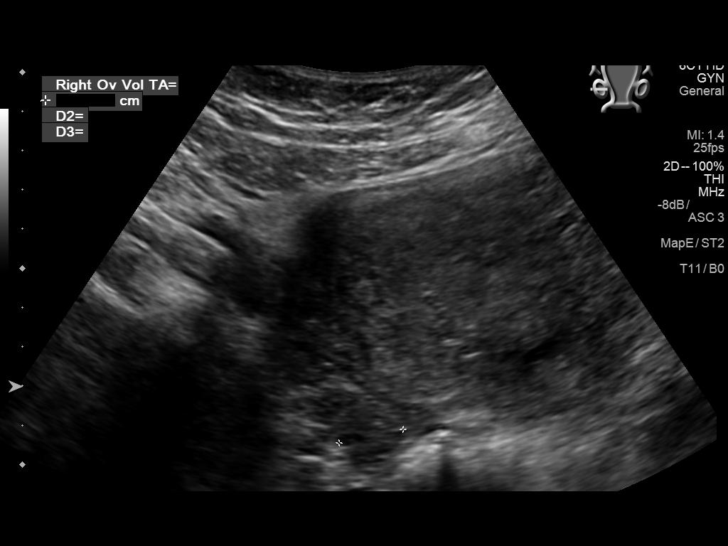
[im 42/51]
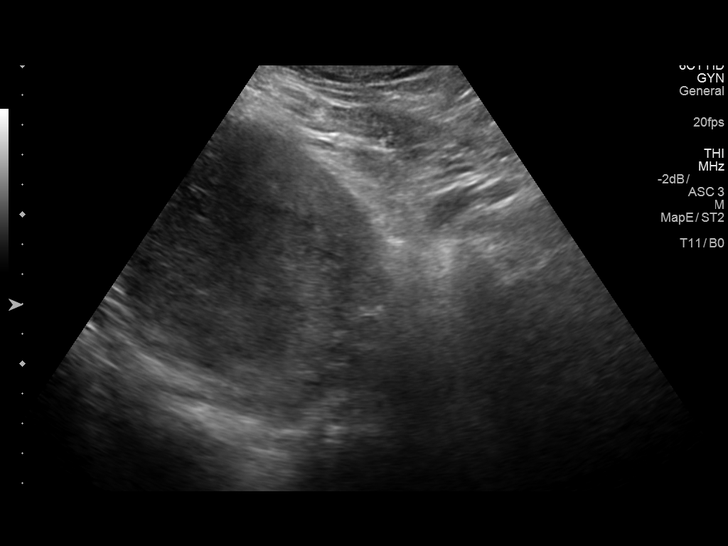
[im 46/51]
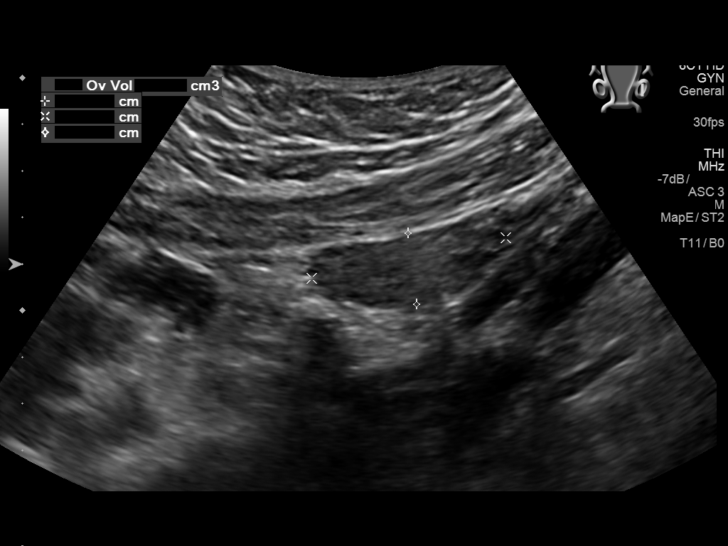
[im 51/51]
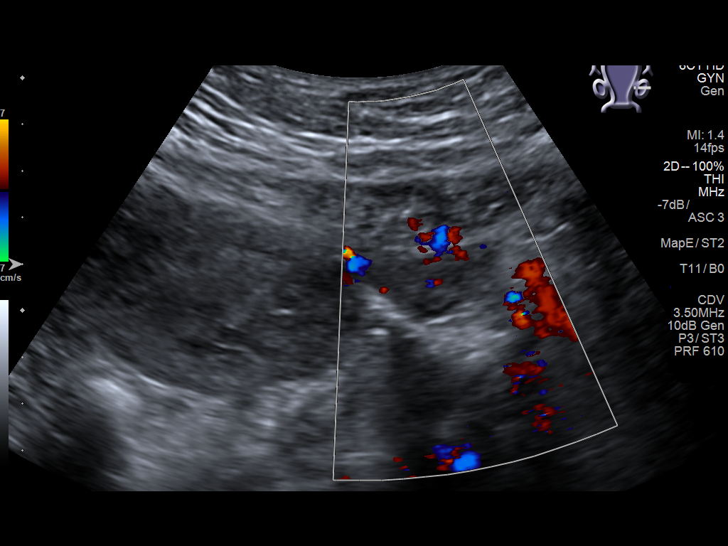

[14 of 25 positions shown; findings below may reference images not displayed]

FINDINGS: Uterus

Measurements: 13.4 x 7.8 x 10.2 cm.. No fibroids or other mass
visualized.

Endometrium

Thickness: 20 mm.. Endometrium is thickened and irregular and
hyperechoic. Possible retained products of conception.

Right ovary

Measurements: 3.1 x 1.9 x 1.7 cm. Normal appearance/no adnexal mass.

Left ovary

Measurements: 4.3 x 1.5 x 2.0 cm. Normal appearance/no adnexal mass.

Other findings

No abnormal free fluid.
IMPRESSION: Enlarged uterus due to recent delivery. Thickened endometrium which
is irregular hyperechoic. Cannot exclude retained products of
conception. No focal fluid collection.

## 2020-04-17 NOTE — Progress Notes (Signed)
Pt present for Nexplanon removal and insertion. Pt stated that she is doing well.

## 2020-04-18 ENCOUNTER — Ambulatory Visit (INDEPENDENT_AMBULATORY_CARE_PROVIDER_SITE_OTHER): Payer: Medicaid Other | Admitting: Obstetrics and Gynecology

## 2020-04-18 ENCOUNTER — Encounter: Payer: Self-pay | Admitting: Obstetrics and Gynecology

## 2020-04-18 ENCOUNTER — Other Ambulatory Visit: Payer: Self-pay

## 2020-04-18 VITALS — BP 134/82 | HR 101 | Ht 67.0 in | Wt 202.4 lb

## 2020-04-18 DIAGNOSIS — Z3046 Encounter for surveillance of implantable subdermal contraceptive: Secondary | ICD-10-CM | POA: Diagnosis not present

## 2020-04-18 NOTE — Progress Notes (Addendum)
    GYNECOLOGY OFFICE PROCEDURE NOTE  Paige Rodriguez is a 24 y.o. G1P1001 here for Nexplanon removal with Nexplanon insertion. Last pap smear: patient has never had one.  No other gynecologic concerns.   Nexplanon Removal and Reinsertion Patient identified, informed consent performed, consent signed.   Patient does understand that irregular bleeding is a very common side effect of this medication. She was advised to have backup contraception for one week after replacement of the implant.   Appropriate time out taken. Nexplanon site identified in left arm.  Area prepped in usual sterile fashon. Three ml of 1% lidocaine was used to anesthetize the area at the distal end of the implant. A small stab incision was made right beside the implant on the distal portion. The Nexplanon rod was grasped using hemostats and removed without difficulty. There was minimal blood loss. There were no complications.  Nexplanon removed from packaging, Device confirmed in needle, then inserted full length of needle and withdrawn per handbook instructions. Nexplanon was able to palpated in the patient's arm; patient palpated the insert herself.  There was minimal blood loss. Patient insertion site covered with gauze and a pressure bandage to reduce any bruising. The patient tolerated the procedure well and was given post procedure instructions.  She was advised to have backup contraception for one week.  Return to clinic for any scheduled appointments or for any gynecologic concerns as needed. Patient will need annual exam in next 1-2 months.     Hildred Laser, MD [facility]

## 2020-04-18 NOTE — Patient Instructions (Signed)
NEXPLANON PLACEMENT POST-PROCEDURE INSTRUCTIONS ° °1. You may take Ibuprofen, Aleve or Tylenol for pain if needed.  Pain should resolve within in 24 hours. ° °2. You may have intercourse after 24 hours.  If you using this for birth control, it is effective immediately. ° °3. You need to call if you have any fever, heavy bleeding, or redness at insertion site. Irregular bleeding is common the first several months after having a Nexplanonplaced. You do not need to call for this reason unless you are concerned. ° °4. Shower or bathe as normal.  You can remove the bandage after 24 hours. ° °

## 2020-07-23 NOTE — Progress Notes (Signed)
Pt present for annual exam. Pt stated that she was doing well no problems.  

## 2020-07-23 NOTE — Patient Instructions (Addendum)
Preventive Care 20-23 Years Old, Female Preventive care refers to visits with your health care provider and lifestyle choices that can promote health and wellness. This includes:  A yearly physical exam. This may also be called an annual well check.  Regular dental visits and eye exams.  Immunizations.  Screening for certain conditions.  Healthy lifestyle choices, such as eating a healthy diet, getting regular exercise, not using drugs or products that contain nicotine and tobacco, and limiting alcohol use. What can I expect for my preventive care visit? Physical exam Your health care provider will check your:  Height and weight. This may be used to calculate body mass index (BMI), which tells if you are at a healthy weight.  Heart rate and blood pressure.  Skin for abnormal spots. Counseling Your health care provider may ask you questions about your:  Alcohol, tobacco, and drug use.  Emotional well-being.  Home and relationship well-being.  Sexual activity.  Eating habits.  Work and work Statistician.  Method of birth control.  Menstrual cycle.  Pregnancy history. What immunizations do I need?  Influenza (flu) vaccine  This is recommended every year. Tetanus, diphtheria, and pertussis (Tdap) vaccine  You may need a Td booster every 10 years. Varicella (chickenpox) vaccine  You may need this if you have not been vaccinated. Human papillomavirus (HPV) vaccine  If recommended by your health care provider, you may need three doses over 6 months. Measles, mumps, and rubella (MMR) vaccine  You may need at least one dose of MMR. You may also need a second dose. Meningococcal conjugate (MenACWY) vaccine  One dose is recommended if you are age 75-21 years and a first-year college student living in a residence hall, or if you have one of several medical conditions. You may also need additional booster doses. Pneumococcal conjugate (PCV13) vaccine  You may need  this if you have certain conditions and were not previously vaccinated. Pneumococcal polysaccharide (PPSV23) vaccine  You may need one or two doses if you smoke cigarettes or if you have certain conditions. Hepatitis A vaccine  You may need this if you have certain conditions or if you travel or work in places where you may be exposed to hepatitis A. Hepatitis B vaccine  You may need this if you have certain conditions or if you travel or work in places where you may be exposed to hepatitis B. Haemophilus influenzae type b (Hib) vaccine  You may need this if you have certain conditions. You may receive vaccines as individual doses or as more than one vaccine together in one shot (combination vaccines). Talk with your health care provider about the risks and benefits of combination vaccines. What tests do I need?  Blood tests  Lipid and cholesterol levels. These may be checked every 5 years starting at age 33.  Hepatitis C test.  Hepatitis B test. Screening  Diabetes screening. This is done by checking your blood sugar (glucose) after you have not eaten for a while (fasting).  Sexually transmitted disease (STD) testing.  BRCA-related cancer screening. This may be done if you have a family history of breast, ovarian, tubal, or peritoneal cancers.  Pelvic exam and Pap test. This may be done every 3 years starting at age 76. Starting at age 102, this may be done every 5 years if you have a Pap test in combination with an HPV test. Talk with your health care provider about your test results, treatment options, and if necessary, the need for more tests.  Follow these instructions at home: Eating and drinking   Eat a diet that includes fresh fruits and vegetables, whole grains, lean protein, and low-fat dairy.  Take vitamin and mineral supplements as recommended by your health care provider.  Do not drink alcohol if: ? Your health care provider tells you not to drink. ? You are  pregnant, may be pregnant, or are planning to become pregnant.  If you drink alcohol: ? Limit how much you have to 0-1 drink a day. ? Be aware of how much alcohol is in your drink. In the [place] one drink equals one 12 oz bottle of beer (355 mL), one 5 oz glass of wine (148 mL), or one 1 oz glass of hard liquor (44 mL). Lifestyle  Take daily care of your teeth and gums.  Stay active. Exercise for at least 30 minutes on 5 or more days each week.  Do not use any products that contain nicotine or tobacco, such as cigarettes, e-cigarettes, and chewing tobacco. If you need help quitting, ask your health care provider.  If you are sexually active, practice safe sex. Use a condom or other form of birth control (contraception) in order to prevent pregnancy and STIs (sexually transmitted infections). If you plan to become pregnant, see your health care provider for a preconception visit. What's next?  Visit your health care provider once a year for a well check visit.  Ask your health care provider how often you should have your eyes and teeth checked.  Stay up to date on all vaccines. This information is not intended to replace advice given to you by your health care provider. Make sure you discuss any questions you have with your health care provider. Document Revised: 08/12/2018 Document Reviewed: 08/12/2018 Elsevier Patient Education  2020 [employer] Breast Self-Awareness Breast self-awareness is knowing how your breasts look and feel. Doing breast self-awareness is important. It allows you to catch a breast problem early while it is still small and can be treated. All women should do breast self-awareness, including women who have had breast implants. Tell your doctor if you notice a change in your breasts. What you need:  A mirror.  A well-lit room. How to do a breast self-exam A breast self-exam is one way to learn what is normal for your breasts and to check for changes. To do a  breast self-exam: Look for changes  1. Take off all the clothes above your waist. 2. Stand in front of a mirror in a room with good lighting. 3. Put your hands on your hips. 4. Push your hands down. 5. Look at your breasts and nipples in the mirror to see if one breast or nipple looks different from the other. Check to see if: ? The shape of one breast is different. ? The size of one breast is different. ? There are wrinkles, dips, and bumps in one breast and not the other. 6. Look at each breast for changes in the skin, such as: ? Redness. ? Scaly areas. 7. Look for changes in your nipples, such as: ? Liquid around the nipples. ? Bleeding. ? Dimpling. ? Redness. ? A change in where the nipples are. Feel for changes  1. Lie on your back on the floor. 2. Feel each breast. To do this, follow these steps: ? Pick a breast to feel. ? Put the arm closest to that breast above your head. ? Use your other arm to feel the nipple area of your breast. Feel   Feel the area with the pads of your three middle fingers by making small circles with your fingers. For the first circle, press lightly. For the second circle, press harder. For the third circle, press even harder. ? Keep making circles with your fingers at the different pressures as you move down your breast. Stop when you feel your ribs. ? Move your fingers a little toward the center of your body. ? Start making circles with your fingers again, this time going up until you reach your collarbone. ? Keep making up-and-down circles until you reach your armpit. Remember to keep using the three pressures. ? Feel the other breast in the same way. 3. Sit or stand in the tub or shower. 4. With soapy water on your skin, feel each breast the same way you did in step 2 when you were lying on the floor. Write down what you find Writing down what you find can help you remember what to tell your doctor. Write down:  What is normal for each breast.  Any  changes you find in each breast, including: ? The kind of changes you find. ? Whether you have pain. ? Size and location of any lumps.  When you last had your menstrual period. General tips  Check your breasts every month.  If you are breastfeeding, the best time to check your breasts is after you feed your baby or after you use a breast pump.  If you get menstrual periods, the best time to check your breasts is 5-7 days after your menstrual period is over.  With time, you will become comfortable with the self-exam, and you will begin to know if there are changes in your breasts. Contact a doctor if you:  See a change in the shape or size of your breasts or nipples.  See a change in the skin of your breast or nipples, such as red or scaly skin.  Have fluid coming from your nipples that is not normal.  Find a lump or thick area that was not there before.  Have pain in your breasts.  Have any concerns about your breast health. Summary  Breast self-awareness includes looking for changes in your breasts, as well as feeling for changes within your breasts.  Breast self-awareness should be done in front of a mirror in a well-lit room.  You should check your breasts every month. If you get menstrual periods, the best time to check your breasts is 5-7 days after your menstrual period is over.  Let your doctor know of any changes you see in your breasts, including changes in size, changes on the skin, pain or tenderness, or fluid from your nipples that is not normal. This information is not intended to replace advice given to you by your health care provider. Make sure you discuss any questions you have with your health care provider. Document Revised: 07/20/2018 Document Reviewed: 07/20/2018 Elsevier Patient Education  2020 [place]: How to Protect Yourself and Others Know how it spreads  There is currently no vaccine to prevent coronavirus disease 2019  (COVID-19).  The best way to prevent illness is to avoid being exposed to this virus.  The virus is thought to spread mainly from person-to-person. ? Between people who are in close contact with one another (within about 6 feet). ? Through respiratory droplets produced when an infected person coughs, sneezes or talks. ? These droplets can land in the mouths or noses of people who are nearby or possibly  be inhaled into the lungs. ? COVID-19 may be spread by people who are not showing symptoms. Everyone should Clean your hands often  Wash your hands often with soap and water for at least 20 seconds especially after you have been in a public place, or after blowing your nose, coughing, or sneezing.  If soap and water are not readily available, use a hand sanitizer that contains at least 60% alcohol. Cover all surfaces of your hands and rub them together until they feel dry.  Avoid touching your eyes, nose, and mouth with unwashed hands. Avoid close contact  Limit contact with others as much as possible.  Avoid close contact with people who are sick.  Put distance between yourself and other people. ? Remember that some people without symptoms may be able to spread virus. ? This is especially important for people who are at higher risk of getting very [employer] Cover your mouth and nose with a mask when around others  You could spread COVID-19 to others even if you do not feel sick.  Everyone should wear a mask in public settings and when around people not living in their household, especially when social distancing is difficult to maintain. ? Masks should not be placed on young children under age 4, anyone who has trouble breathing, or is unconscious, incapacitated or otherwise unable to remove the mask without assistance.  The mask is meant to protect other people in case you are infected.  Do NOT use a  facemask meant for a Dietitian.  Continue to keep about 6 feet between yourself and others. The mask is not a substitute for social distancing. Cover coughs and sneezes  Always cover your mouth and nose with a tissue when you cough or sneeze or use the inside of your elbow.  Throw used tissues in the trash.  Immediately wash your hands with soap and water for at least 20 seconds. If soap and water are not readily available, clean your hands with a hand sanitizer that contains at least 60% alcohol. Clean and disinfect  Clean AND disinfect frequently touched surfaces daily. This includes tables, doorknobs, light switches, countertops, handles, desks, phones, keyboards, toilets, faucets, and sinks. [employer]  If surfaces are dirty, clean them: Use detergent or soap and water prior to disinfection.  Then, use a household disinfectant. You can see a list of EPA-registered household disinfectants here. [employer]com 08/17/2019 This information is not intended to replace advice given to you by your health care provider. Make sure you discuss any questions you have with your health care provider. Document Revised: 08/25/2019 Document Reviewed: 06/23/2019 Elsevier Patient Education  [place].

## 2020-07-24 ENCOUNTER — Ambulatory Visit (INDEPENDENT_AMBULATORY_CARE_PROVIDER_SITE_OTHER): Payer: Medicaid Other | Admitting: Obstetrics and Gynecology

## 2020-07-24 ENCOUNTER — Encounter: Payer: Self-pay | Admitting: Obstetrics and Gynecology

## 2020-07-24 ENCOUNTER — Other Ambulatory Visit (HOSPITAL_COMMUNITY)
Admission: RE | Admit: 2020-07-24 | Discharge: 2020-07-24 | Disposition: A | Discharge status: Home / Self Care | Payer: Medicaid Other | Source: Ambulatory Visit | Attending: Obstetrics and Gynecology | Admitting: Obstetrics and Gynecology

## 2020-07-24 VITALS — BP 129/83 | HR 98 | Ht 67.0 in | Wt 211.5 lb

## 2020-07-24 DIAGNOSIS — Z13220 Encounter for screening for lipoid disorders: Secondary | ICD-10-CM

## 2020-07-24 DIAGNOSIS — Z124 Encounter for screening for malignant neoplasm of cervix: Secondary | ICD-10-CM

## 2020-07-24 DIAGNOSIS — Z01419 Encounter for gynecological examination (general) (routine) without abnormal findings: Secondary | ICD-10-CM | POA: Insufficient documentation

## 2020-07-24 DIAGNOSIS — Z975 Presence of (intrauterine) contraceptive device: Secondary | ICD-10-CM

## 2020-07-24 DIAGNOSIS — Z131 Encounter for screening for diabetes mellitus: Secondary | ICD-10-CM

## 2020-07-24 DIAGNOSIS — E669 Obesity, unspecified: Secondary | ICD-10-CM

## 2020-07-24 NOTE — Progress Notes (Signed)
GYNECOLOGY ANNUAL PHYSICAL EXAM PROGRESS NOTE  Subjective:    Paige Rodriguez is a 24 y.o. G26P1001 female who presents for an annual exam. The patient has no complaints today. The patient is sexually active. The patient wears seatbelts: yes. Does the patient participate in regular exercise: no. Has the patient ever been transfused or tattooed?: yes. The patient reports that there is not domestic violence in her life.    Gynecologic History Contraception: Nexplanon (inserted 04/18/2020). History of STI's: Denies Last Pap: no prior pap history.    Menstrual History: Menarche age: 29 Patient's last menstrual period was 07/09/2020. Period Duration (Days): 3 Period Pattern: (!) Irregular Menstrual Flow: Light, Moderate Menstrual Control: Tampon Menstrual Control Change Freq (Hours): 2-3 Dysmenorrhea: (!) Moderate Dysmenorrhea Symptoms: Cramping, Headache    OB History  Gravida Para Term Preterm AB Living  1 1 1  0 0 1  SAB TAB Ectopic Multiple Live Births  0 0 0 0 1    # Outcome Date GA Lbr Len/2nd Weight Sex Delivery Anes PTL Lv  1 Term 02/10/17 [redacted]w[redacted]d / 01:17 8 lb 3.2 oz (3.72 kg) F Vag-Spont EPI  LIV     Name: Paige Rodriguez     Apgar1: 8  Apgar5: 9    Past Medical History:  Diagnosis Date  . History of recurrent UTIs    age 49-7, had surgery to correct on renal tubes    Past Surgical History:  Procedure Laterality Date  . KIDNEY SURGERY     had surgery on renal tubules for h/o recurrent UTIs    Family History  Problem Relation Age of Onset  . Healthy Mother   . Healthy Father   . Healthy Maternal Grandmother   . Dementia Maternal Grandfather   . Alzheimer's disease Maternal Grandfather     Social History   Socioeconomic History  . Marital status: Single    Spouse name: Not on file  . Number of children: Not on file  . Years of education: Not on file  . Highest education level: Not on file  Occupational History  . Not on file  Tobacco Use  .  Smoking status: Never Smoker  . Smokeless tobacco: Never Used  Vaping Use  . Vaping Use: Never used  Substance and Sexual Activity  . Alcohol use: Yes    Comment: occass  . Drug use: No  . Sexual activity: Not Currently    Birth control/protection: Implant  Other Topics Concern  . Not on file  Social History Narrative  . Not on file   Social Determinants of Health   Financial Resource Strain:   . Difficulty of Paying Living Expenses:   Food Insecurity:   . Worried About 4-6 in the Last Year:   . Programme researcher, broadcasting/film/video in the Last Year:   Transportation Needs:   . Barista (Medical):   Freight forwarder Lack of Transportation (Non-Medical):   Physical Activity:   . Days of Exercise per Week:   . Minutes of Exercise per Session:   Stress:   . Feeling of Stress :   Social Connections:   . Frequency of Communication with Friends and Family:   . Frequency of Social Gatherings with Friends and Family:   . Attends Religious Services:   . Active Member of Clubs or Organizations:   . Attends [employer] Meetings:   Banker Marital Status:   Intimate Partner Violence:   . Fear of Current or Ex-Partner:   .  Emotionally Abused:   [employer] Physically Abused:   . Sexually Abused:     Current Outpatient Medications on File Prior to Visit  Medication Sig Dispense Refill  . etonogestrel (NEXPLANON) 68 MG IMPL implant 1 each by Subdermal route once. Inserted 04/18/2020    . ibuprofen (ADVIL,MOTRIN) 800 MG tablet Take 1 tablet (800 mg total) by mouth every 8 (eight) hours as needed. 60 tablet 1  . Multiple Vitamin (MULTI-VITAMIN DAILY PO) Take by mouth.     No current facility-administered medications on file prior to visit.    No Known Allergies    Review of Systems Constitutional: negative for chills, fatigue, fevers and sweats Eyes: negative for irritation, redness and visual disturbance Ears, nose, mouth, throat, and face: negative for hearing loss, nasal congestion,  snoring and tinnitus Respiratory: negative for asthma, cough, sputum Cardiovascular: negative for chest pain, dyspnea, exertional chest pressure/discomfort, irregular heart beat, palpitations and syncope Gastrointestinal: negative for abdominal pain, change in bowel habits, nausea and vomiting Genitourinary: negative for abnormal menstrual periods, genital lesions, sexual problems and vaginal discharge, dysuria and urinary incontinence Integument/breast: negative for breast lump, breast tenderness and nipple discharge Hematologic/lymphatic: negative for bleeding and easy bruising Musculoskeletal:negative for back pain and muscle weakness Neurological: negative for dizziness, headaches, vertigo and weakness Endocrine: negative for diabetic symptoms including polydipsia, polyuria and skin dryness Allergic/Immunologic: negative for hay fever and urticaria      Objective:  Blood pressure 129/83, pulse 98, height 5\' 7"  (1.702 m), weight 211 lb 8 oz (95.9 kg), last menstrual period 07/09/2020. Body mass index is 33.13 kg/m.  General Appearance:    Alert, cooperative, no distress, appears stated age, mild obesity  Head:    Normocephalic, without obvious abnormality, atraumatic  Eyes:    PERRL, conjunctiva/corneas clear, EOM's intact, both eyes  Ears:    Normal external ear canals, both ears  Nose:   Nares normal, septum midline, mucosa normal, no drainage or sinus tenderness  Throat:   Lips, mucosa, and tongue normal; teeth and gums normal  Neck:   Supple, symmetrical, trachea midline, no adenopathy; thyroid: no enlargement/tenderness/nodules; no carotid bruit or JVD  Back:     Symmetric, no curvature, ROM normal, no CVA tenderness  Lungs:     Clear to auscultation bilaterally, respirations unlabored  Chest Wall:    No tenderness or deformity   Heart:    Regular rate and rhythm, S1 and S2 normal, no murmur, rub or gallop  Breast Exam:    No tenderness, masses, or nipple abnormality  Abdomen:      Soft, non-tender, bowel sounds active all four quadrants, no masses, no organomegaly.    Genitalia:    Pelvic:external genitalia normal, vagina without lesions, discharge, or tenderness, rectovaginal septum  normal. Cervix normal in appearance, no cervical motion tenderness, no adnexal masses or tenderness.  Uterus normal size, shape, mobile, regular contours, nontender.  Rectal:    Normal external sphincter.  No hemorrhoids appreciated. Internal exam not done.   Extremities:   Extremities normal, atraumatic, no cyanosis or edema  Pulses:   2+ and symmetric all extremities  Skin:   Skin color, texture, turgor normal, no rashes or lesions  Lymph nodes:   Cervical, supraclavicular, and axillary nodes normal  Neurologic:   CNII-XII intact, normal strength, sensation and reflexes throughout   .  Labs:  Lab Results  Component Value Date   WBC 14.3 (H) 02/16/2017   HGB 13.6 02/16/2017   HCT 40.0 02/16/2017   MCV 86.6 02/16/2017  PLT 325 02/16/2017    Lab Results  Component Value Date   CREATININE 0.65 02/16/2017   BUN 18 02/16/2017   NA 138 02/16/2017   K 3.8 02/16/2017   CL 102 02/16/2017   CO2 25 02/16/2017    Lab Results  Component Value Date   ALT 26 02/16/2017   AST 27 02/16/2017   ALKPHOS 133 (H) 02/16/2017   BILITOT 0.5 02/16/2017    No results found for: TSH   Assessment:   1. Encounter for well woman exam with routine gynecological exam   2. Screening for diabetes mellitus   3. Screening for lipid disorders   4. Cervical cancer screening   5. Obesity (BMI 30.0-34.9)   6. Nexplanon in place    Plan:    Blood tests: see orders. Breast self exam technique reviewed and patient encouraged to perform self-exam monthly. Contraception: Nexplanon. Discussed healthy lifestyle modifications. Pap smear performed today. COVID vaccination status: patient declines. Follow up in 1 year.    [facility] - 07/24/20 1357      Pregnancy Intention Screening   Does the  patient want to become pregnant in the next year? No    Does the patient's partner want to become pregnant in the next year? N/A    Would the patient like to discuss contraceptive options today? No      Contraception Wrap Up   Current Method Hormonal Implant          The pregnancy intention screening data noted above was reviewed. Potential methods of contraception were discussed. The patient elected to continue with Hormonal Implant.     Hildred Laser, MD [facility]

## 2020-07-25 LAB — COMPREHENSIVE METABOLIC PANEL
ALT: 17 IU/L (ref 0–32)
AST: 17 IU/L (ref 0–40)
Albumin/Globulin Ratio: 2 (ref 1.2–2.2)
Albumin: 5 g/dL (ref 3.9–5.0)
Alkaline Phosphatase: 122 IU/L — ABNORMAL HIGH (ref 48–121)
BUN/Creatinine Ratio: 11 (ref 9–23)
BUN: 11 mg/dL (ref 6–20)
Bilirubin Total: 0.3 mg/dL (ref 0.0–1.2)
CO2: 23 mmol/L (ref 20–29)
Calcium: 9.7 mg/dL (ref 8.7–10.2)
Chloride: 102 mmol/L (ref 96–106)
Creatinine, Ser: 0.99 mg/dL (ref 0.57–1.00)
GFR calc Af Amer: 93 mL/min/{1.73_m2} (ref >59)
GFR calc non Af Amer: 81 mL/min/{1.73_m2} (ref >59)
Globulin, Total: 2.5 g/dL (ref 1.5–4.5)
Glucose: 86 mg/dL (ref 65–99)
Potassium: 4.7 mmol/L (ref 3.5–5.2)
Sodium: 140 mmol/L (ref 134–144)
Total Protein: 7.5 g/dL (ref 6.0–8.5)

## 2020-07-25 LAB — CBC
Hematocrit: 43.1 % (ref 34.0–46.6)
Hemoglobin: 14.2 g/dL (ref 11.1–15.9)
MCH: 28.2 pg (ref 26.6–33.0)
MCHC: 32.9 g/dL (ref 31.5–35.7)
MCV: 86 fL (ref 79–97)
Platelets: 337 10*3/uL (ref 150–450)
RBC: 5.03 x10E6/uL (ref 3.77–5.28)
RDW: 12.7 % (ref 11.7–15.4)
WBC: 11 10*3/uL — ABNORMAL HIGH (ref 3.4–10.8)

## 2020-07-25 LAB — LIPID PANEL
Chol/HDL Ratio: 4.2 ratio (ref 0.0–4.4)
Cholesterol, Total: 195 mg/dL (ref 100–199)
HDL: 46 mg/dL (ref >39)
LDL Chol Calc (NIH): 109 mg/dL — ABNORMAL HIGH (ref 0–99)
Triglycerides: 230 mg/dL — ABNORMAL HIGH (ref 0–149)
VLDL Cholesterol Cal: 40 mg/dL (ref 5–40)

## 2020-07-25 LAB — TSH: TSH: 1.8 u[IU]/mL (ref 0.450–4.500)

## 2020-07-25 LAB — HEMOGLOBIN A1C
Est. average glucose Bld gHb Est-mCnc: 111 mg/dL
Hgb A1c MFr Bld: 5.5 % (ref 4.8–5.6)

## 2020-07-26 LAB — CYTOLOGY - PAP
Chlamydia: NEGATIVE
Comment: NEGATIVE
Comment: NORMAL
Diagnosis: NEGATIVE
Neisseria Gonorrhea: NEGATIVE

## 2020-09-06 ENCOUNTER — Other Ambulatory Visit: Payer: Medicaid Other

## 2020-09-06 DIAGNOSIS — Z20822 Contact with and (suspected) exposure to covid-19: Secondary | ICD-10-CM

## 2020-09-08 LAB — NOVEL CORONAVIRUS, NAA: SARS-CoV-2, NAA: NOT DETECTED

## 2020-09-08 LAB — SARS-COV-2, NAA 2 DAY TAT

## 2021-02-15 ENCOUNTER — Encounter: Payer: Self-pay | Admitting: Obstetrics and Gynecology

## 2021-07-30 ENCOUNTER — Encounter: Payer: Medicaid Other | Admitting: Obstetrics and Gynecology

## 2021-08-29 ENCOUNTER — Encounter: Payer: Medicaid Other | Admitting: Obstetrics and Gynecology

## 2021-10-22 NOTE — Patient Instructions (Signed)
Breast Self-Awareness Breast self-awareness is knowing how your breasts look and feel. Doing breast self-awareness is important. It allows you to catch a breast problem early while it is still small and can be treated. All women should do breast self-awareness, including women who have had breast implants. Tell your doctor if you notice a change in your breasts. What you need: A mirror. A well-lit room. How to do a breast self-exam A breast self-exam is one way to learn what is normal for your breasts and to check for changes. To do a breast self-exam: Look for changes  Take off all the clothes above your waist. Stand in front of a mirror in a room with good lighting. Put your hands on your hips. Push your hands down. Look at your breasts and nipples in the mirror to see if one breast or nipple looks different from the other. Check to see if: The shape of one breast is different. The size of one breast is different. There are wrinkles, dips, and bumps in one breast and not the other. Look at each breast for changes in the skin, such as: Redness. Scaly areas. Look for changes in your nipples, such as: Liquid around the nipples. Bleeding. Dimpling. Redness. A change in where the nipples are. Feel for changes  Lie on your back on the floor. Feel each breast. To do this, follow these steps: Pick a breast to feel. Put the arm closest to that breast above your head. Use your other arm to feel the nipple area of your breast. Feel the area with the pads of your three middle fingers by making small circles with your fingers. For the first circle, press lightly. For the second circle, press harder. For the third circle, press even harder. Keep making circles with your fingers at the different pressures as you move down your breast. Stop when you feel your ribs. Move your fingers a little toward the center of your body. Start making circles with your fingers again, this time going up until  you reach your collarbone. Keep making up-and-down circles until you reach your armpit. Remember to keep using the three pressures. Feel the other breast in the same way. Sit or stand in the tub or shower. With soapy water on your skin, feel each breast the same way you did in step 2 when you were lying on the floor. Write down what you find Writing down what you find can help you remember what to tell your doctor. Write down: What is normal for each breast. Any changes you find in each breast, including: The kind of changes you find. Whether you have pain. Size and location of any lumps. When you last had your menstrual period. General tips Check your breasts every month. If you are breastfeeding, the best time to check your breasts is after you feed your baby or after you use a breast pump. If you get menstrual periods, the best time to check your breasts is 5-7 days after your menstrual period is over. With time, you will become comfortable with the self-exam, and you will begin to know if there are changes in your breasts. Contact a doctor if you: See a change in the shape or size of your breasts or nipples. See a change in the skin of your breast or nipples, such as red or scaly skin. Have fluid coming from your nipples that is not normal. Find a lump or thick area that was not there before. Have pain in   your breasts. °Have any concerns about your breast health. °Summary °Breast self-awareness includes looking for changes in your breasts, as well as feeling for changes within your breasts. °Breast self-awareness should be done in front of a mirror in a well-lit room. °You should check your breasts every month. If you get menstrual periods, the best time to check your breasts is 5-7 days after your menstrual period is over. °Let your doctor know of any changes you see in your breasts, including changes in size, changes on the skin, pain or tenderness, or fluid from your nipples that is not  normal. °This information is not intended to replace advice given to you by your health care provider. Make sure you discuss any questions you have with your health care provider. °Document Revised: 07/20/2018 Document Reviewed: 07/20/2018 °Elsevier Patient Education © 2022 [employer] °Preventive Care 21-39 Years Old, Female °Preventive care refers to lifestyle choices and visits with your health care provider that can promote health and wellness. Preventive care visits are also called wellness exams. °What can I expect for my preventive care visit? °Counseling °During your preventive care visit, your health care provider may ask about your: °Medical history, including: °Past medical problems. °Family medical history. °Pregnancy history. °Current health, including: °Menstrual cycle. °Method of birth control. °Emotional well-being. °Home life and relationship well-being. °Sexual activity and sexual health. °Lifestyle, including: °Alcohol, nicotine or tobacco, and drug use. °Access to firearms. °Diet, exercise, and sleep habits. °Work and work environment. °Sunscreen use. °Safety issues such as seatbelt and bike helmet use. °Physical exam °Your health care provider may check your: °Height and weight. These may be used to calculate your BMI (body mass index). BMI is a measurement that tells if you are at a healthy weight. °Waist circumference. This measures the distance around your waistline. This measurement also tells if you are at a healthy weight and may help predict your risk of certain diseases, such as type 2 diabetes and high blood pressure. °Heart rate and blood pressure. °Body temperature. °Skin for abnormal spots. °What immunizations do I need? °Vaccines are usually given at various ages, according to a schedule. Your health care provider will recommend vaccines for you based on your age, medical history, and lifestyle or other factors, such as travel or where you work. °What tests do I  need? °Screening °Your health care provider may recommend screening tests for certain conditions. This may include: °Pelvic exam and Pap test. °Lipid and cholesterol levels. °Diabetes screening. This is done by checking your blood sugar (glucose) after you have not eaten for a while (fasting). °Hepatitis B test. °Hepatitis C test. °HIV (human immunodeficiency virus) test. °STI (sexually transmitted infection) testing, if you are at risk. °BRCA-related cancer screening. This may be done if you have a family history of breast, ovarian, tubal, or peritoneal cancers. °Talk with your health care provider about your test results, treatment options, and if necessary, the need for more tests. °Follow these instructions at home: °Eating and drinking ° °Eat a healthy diet that includes fresh fruits and vegetables, whole grains, lean protein, and low-fat dairy products. °Take vitamin and mineral supplements as recommended by your health care provider. °Do not drink alcohol if: °Your health care provider tells you not to drink. °You are pregnant, may be pregnant, or are planning to become pregnant. °If you drink alcohol: °Limit how much you have to 0-1 drink a day. °Know how much alcohol is in your drink. In the [place] one drink equals one 12 oz   bottle of beer (355 mL), one 5 oz glass of wine (148 mL), or one 1½ oz glass of hard liquor (44 mL). °Lifestyle °Brush your teeth every morning and night with fluoride toothpaste. Floss one time each day. °Exercise for at least 30 minutes 5 or more days each week. °Do not use any products that contain nicotine or tobacco. These products include cigarettes, chewing tobacco, and vaping devices, such as e-cigarettes. If you need help quitting, ask your health care provider. °Do not use drugs. °If you are sexually active, practice safe sex. Use a condom or other form of protection to prevent STIs. °If you do not wish to become pregnant, use a form of birth control. If you plan to become  pregnant, see your health care provider for a prepregnancy visit. °Find healthy ways to manage stress, such as: °Meditation, yoga, or listening to music. °Journaling. °Talking to a trusted person. °Spending time with friends and family. °Minimize exposure to UV radiation to reduce your risk of skin cancer. °Safety °Always wear your seat belt while driving or riding in a vehicle. °Do not drive: °If you have been drinking alcohol. Do not ride with someone who has been drinking. °If you have been using any mind-altering substances or drugs. °While texting. °When you are tired or distracted. °Wear a helmet and other protective equipment during sports activities. °If you have firearms in your house, make sure you follow all gun safety procedures. °Seek help if you have been physically or sexually abused. °What's next? °Go to your health care provider once a year for an annual wellness visit. °Ask your health care provider how often you should have your eyes and teeth checked. °Stay up to date on all vaccines. °This information is not intended to replace advice given to you by your health care provider. Make sure you discuss any questions you have with your health care provider. °Document Revised: 05/29/2021 Document Reviewed: 05/29/2021 °Elsevier Patient Education © 2022 [employer] ° °

## 2021-10-22 NOTE — Progress Notes (Signed)
GYNECOLOGY ANNUAL PHYSICAL EXAM PROGRESS NOTE  Subjective:    Paige Rodriguez is a 25 y.o. G52P1001 female who presents for an annual exam. The patient has no complaints today. The patient is sexually active. The patient wears seatbelts: yes. Does the patient participate in regular exercise: no. Has the patient ever been transfused or tattooed?: yes. The patient reports that there is not domestic violence in her life.    Gynecologic History Contraception: Nexplanon (inserted 04/18/2020). History of STI's: Denies Last Pap: 07/24/2020. Results were: normal   Menstrual History: Menarche age: 77 Patient's last menstrual period was 09/14/2021. Period Duration (Days): 3 Period Pattern: (!) Irregular Menstrual Flow: Moderate Menstrual Control: Tampon Menstrual Control Change Freq (Hours): 1-2 Dysmenorrhea: (!) Moderate Dysmenorrhea Symptoms: Cramping, Headache    Upstream - 10/23/21 1419       Pregnancy Intention Screening   Does the patient want to become pregnant in the next year? Yes    Does the patient's partner want to become pregnant in the next year? N/A    Would the patient like to discuss contraceptive options today? No      Contraception Wrap Up   Current Method Hormonal Implant    End Method Hormonal Implant    Contraception Counseling Provided No            The pregnancy intention screening data noted above was reviewed. Potential methods of contraception were discussed. The patient elected to proceed with Hormonal Implant.   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 for h/o recurrent UTIs    Family History  Problem Relation Age of Onset   Healthy Mother    Healthy Father    Healthy Maternal Grandmother    Dementia Maternal Grandfather    Alzheimer's disease Maternal Grandfather     Social History   Socioeconomic History   Marital status: Single    Spouse name: Not on file   Number of children: Not on file   Years of education: Not on file   Highest education level: Not on file  Occupational History   Not on file  Tobacco Use   Smoking status: Never   Smokeless tobacco: Never  Vaping Use   Vaping Use: Never used  Substance and Sexual Activity   Alcohol use: Yes    Comment: occass   Drug use: No   Sexual activity: Not Currently    Birth control/protection: Implant  Other Topics Concern   Not on file  Social History Narrative   Not on file   Social Determinants of Health   Financial Resource Strain: Not on file  Food Insecurity: Not on file  Transportation Needs: Not on file  Physical Activity: Not on file  Stress: Not on file  Social Connections: Not on file  Intimate Partner Violence:  Not on file    Current Outpatient Medications on File Prior to Visit  Medication Sig Dispense Refill   etonogestrel (NEXPLANON) 68 MG IMPL implant 1 each by Subdermal route once. Inserted 04/18/2020     ibuprofen (ADVIL,MOTRIN) 800 MG tablet Take 1 tablet (800 mg total) by mouth every 8 (eight) hours as needed. 60 tablet 1   Multiple Vitamin (MULTI-VITAMIN DAILY PO) Take by mouth.     No current facility-administered medications on file prior to visit.    No Known Allergies    Review of Systems Constitutional: negative for chills, fatigue, fevers and sweats Eyes: negative for irritation, redness and visual disturbance Ears, nose, mouth, throat, and face: negative for hearing loss, nasal congestion, snoring and tinnitus Respiratory:  negative for asthma, cough, sputum Cardiovascular: negative for chest pain, dyspnea, exertional chest pressure/discomfort, irregular heart beat, palpitations and syncope Gastrointestinal: negative for abdominal pain, change in bowel habits, nausea and vomiting Genitourinary: negative for abnormal menstrual periods, genital lesions, sexual problems and vaginal discharge, dysuria and urinary incontinence Integument/breast: negative for breast lump, breast tenderness and nipple discharge Hematologic/lymphatic: negative for bleeding and easy bruising Musculoskeletal:negative for back pain and muscle weakness Neurological: negative for dizziness, headaches, vertigo and weakness Endocrine: negative for diabetic symptoms including polydipsia, polyuria and skin dryness Allergic/Immunologic: negative for hay fever and urticaria      Objective:  BP 121/77 (BP Location: Left Arm, Patient Position: Sitting, Cuff Size: Normal)   Pulse 75   Ht 5\' 7"  (1.702 m)   Wt 234 lb 12.8 oz (106.5 kg)   LMP 09/14/2021   BMI 36.77 kg/m    General Appearance:    Alert, cooperative, no distress, appears stated age, mild obesity  Head:    Normocephalic, without obvious abnormality, atraumatic  Eyes:    PERRL, conjunctiva/corneas clear, EOM's intact, both eyes  Ears:    Normal external ear canals, both ears  Nose:   Nares normal, septum midline, mucosa normal, no drainage or sinus tenderness  Throat:   Lips, mucosa, and tongue normal; teeth and gums normal  Neck:   Supple, symmetrical, trachea midline, no adenopathy; thyroid: no enlargement/tenderness/nodules; no carotid bruit or JVD  Back:     Symmetric, no curvature, ROM normal, no CVA tenderness  Lungs:     Clear to auscultation bilaterally, respirations unlabored  Chest Wall:    No tenderness or deformity   Heart:    Regular rate and rhythm, S1 and S2 normal, no murmur, rub or gallop  Breast Exam:    No tenderness, masses, or nipple abnormality  Abdomen:      Soft, non-tender, bowel sounds active all four quadrants, no masses, no organomegaly.    Genitalia:    Pelvic:external genitalia normal, vagina without lesions, discharge, or tenderness, rectovaginal septum  normal. Cervix normal in appearance, no cervical motion tenderness, no adnexal masses or tenderness.  Uterus normal size, shape, mobile, regular contours, nontender.  Rectal:    Normal external sphincter.  No hemorrhoids appreciated. Internal exam not done.   Extremities:   Extremities normal, atraumatic, no cyanosis or edema  Pulses:   2+ and symmetric all extremities  Skin:   Skin color, texture, turgor normal, no rashes or lesions  Lymph nodes:   Cervical, supraclavicular, and axillary nodes normal  Neurologic:   CNII-XII intact, normal strength, sensation and reflexes throughout   .  Labs:  Lab Results  Component Value Date   WBC 11.0 (H) 07/24/2020   HGB 14.2 07/24/2020   HCT 43.1  07/24/2020   MCV 86 07/24/2020   PLT 337 07/24/2020    Lab Results  Component Value Date   CREATININE 0.99 07/24/2020   BUN 11 07/24/2020   NA 140 07/24/2020   K 4.7 07/24/2020   CL 102 07/24/2020   CO2 23 07/24/2020    Lab Results  Component Value Date   ALT 17 07/24/2020   AST 17 07/24/2020   ALKPHOS 122 (H) 07/24/2020   BILITOT 0.3 07/24/2020    Lab Results  Component Value Date   TSH 1.800 07/24/2020    Lab Results  Component Value Date   CHOL 195 07/24/2020   HDL 46 07/24/2020   LDLCALC 109 (H) 07/24/2020   TRIG 230 (H) 07/24/2020   CHOLHDL 4.2 07/24/2020    Assessment:   1. Encounter for well woman exam with routine gynecological exam   2. High triglycerides   3. Obesity (BMI 30.0-34.9)   4. Nexplanon in place    Plan:    Blood tests:CBC, CMET, Lipid panel, and random glucose (patient has fasted). Breast self exam technique reviewed and patient encouraged to perform self-exam monthly. Contraception: Nexplanon. Discussed healthy lifestyle modifications. Pap  smear 07/24/2020. COVID vaccination status: patient declines. Flu vaccination status: declined.  Follow up in 1 year.     Rubie Maid, MD [facility]

## 2021-10-23 ENCOUNTER — Encounter: Payer: Self-pay | Admitting: Obstetrics and Gynecology

## 2021-10-23 ENCOUNTER — Other Ambulatory Visit: Payer: Self-pay

## 2021-10-23 ENCOUNTER — Ambulatory Visit (INDEPENDENT_AMBULATORY_CARE_PROVIDER_SITE_OTHER): Payer: Medicaid Other | Admitting: Obstetrics and Gynecology

## 2021-10-23 VITALS — BP 121/77 | HR 75 | Ht 67.0 in | Wt 234.8 lb

## 2021-10-23 DIAGNOSIS — Z13220 Encounter for screening for lipoid disorders: Secondary | ICD-10-CM

## 2021-10-23 DIAGNOSIS — Z01419 Encounter for gynecological examination (general) (routine) without abnormal findings: Secondary | ICD-10-CM

## 2021-10-23 DIAGNOSIS — E781 Pure hyperglyceridemia: Secondary | ICD-10-CM

## 2021-10-23 DIAGNOSIS — E669 Obesity, unspecified: Secondary | ICD-10-CM

## 2021-10-23 DIAGNOSIS — Z975 Presence of (intrauterine) contraceptive device: Secondary | ICD-10-CM

## 2022-02-06 NOTE — Progress Notes (Signed)
° ° °  GYNECOLOGY PROGRESS NOTE  Subjective:    Patient ID: Paige Rodriguez, female    DOB: [birth year], 26 y.o.   MRN: [id number]  HPI  Patient is a 26 y.o. G10P1001 female who presents to discuss Nexplanon removal. Nexplanon was inserted on 04/18/2020. She got nexplanon to regulate her cycles. Her cycles have not regulated since she had implant placed. She has had 4 periods in the last 2 months. Notes that she also is not sexually active, so would prefer to have it it removed.   Period Duration (Days): 4 Period Pattern: (!) Irregular Menstrual Flow: Moderate Menstrual Control: Tampon Menstrual Control Change Freq (Hours): 1-2 Dysmenorrhea: (!) Mild Dysmenorrhea Symptoms: Cramping   The following portions of the patient's history were reviewed and updated as appropriate: allergies, current medications, past family history, past medical history, past social history, past surgical history, and problem list.  Review of Systems Pertinent items noted in HPI and remainder of comprehensive ROS otherwise negative.   Objective:   Blood pressure 124/73, pulse 79, resp. rate 16, height 5\' 7"  (1.702 m), weight 233 lb 4.8 oz (105.8 kg).  Body mass index is 36.54 kg/m.  General appearance: alert, cooperative, and no distress Remainder of exam deferred.    Assessment:   1. Nexplanon removal   2. Breakthrough bleeding on Nexplanon      Plan:   Discussed options of hormonal supplementation with estrogen/NSAIDs vs removal. Patient desires removal. See procedure note below.     GYNECOLOGY OFFICE PROCEDURE NOTE  Paige Rodriguez is a 26 y.o. G1P1001 here for Nexplanon removal.  Last pap smear was on 07/24/2020 and was normal.  No other gynecologic concerns.   Nexplanon Removal Patient identified, informed consent performed, consent signed.   Appropriate time out taken. Nexplanon site identified.  Area prepped in usual sterile fashon. One ml of 1% lidocaine was used to anesthetize the area at the  distal end of the implant. A small stab incision was made right beside the implant on the distal portion.  The Nexplanon rod was grasped using hemostats and removed without difficulty.  There was minimal blood loss. There were no complications.  3 ml of 1% lidocaine was injected around the incision for post-procedure analgesia.  Steri-strips were applied over the small incision.  A pressure bandage was applied to reduce any bruising.  The patient tolerated the procedure well and was given post procedure instructions.  Patient is planning to use abstinence for contraception.    09/23/2020, MD [facility]

## 2022-02-11 ENCOUNTER — Other Ambulatory Visit: Payer: Self-pay

## 2022-02-11 ENCOUNTER — Encounter: Payer: Self-pay | Admitting: Obstetrics and Gynecology

## 2022-02-11 ENCOUNTER — Ambulatory Visit: Payer: Medicaid Other | Admitting: Obstetrics and Gynecology

## 2022-02-11 VITALS — BP 124/73 | HR 79 | Resp 16 | Ht 67.0 in | Wt 233.3 lb

## 2022-02-11 DIAGNOSIS — Z3046 Encounter for surveillance of implantable subdermal contraceptive: Secondary | ICD-10-CM | POA: Diagnosis not present

## 2022-02-11 DIAGNOSIS — Z975 Presence of (intrauterine) contraceptive device: Secondary | ICD-10-CM

## 2022-02-11 DIAGNOSIS — N921 Excessive and frequent menstruation with irregular cycle: Secondary | ICD-10-CM

## 2022-02-11 NOTE — Patient Instructions (Signed)
NEXPLANON REMOVAL  POST-PROCEDURE INSTRUCTIONS  You may take Ibuprofen, Aleve or Tylenol for pain if needed.  Pain should resolve within in 24 hours.   You need to call if you have any fever, heavy bleeding, or redness at insertion site.   Shower or bathe as normal.  You can remove the bandage after 24 hours.

## 2022-05-28 ENCOUNTER — Ambulatory Visit: Payer: Medicaid Other | Admitting: Obstetrics and Gynecology

## 2022-05-28 ENCOUNTER — Encounter: Payer: Self-pay | Admitting: Obstetrics and Gynecology

## 2022-05-28 VITALS — BP 109/74 | HR 79 | Resp 16 | Ht 67.0 in | Wt 227.3 lb

## 2022-05-28 DIAGNOSIS — N6324 Unspecified lump in the left breast, lower inner quadrant: Secondary | ICD-10-CM | POA: Diagnosis not present

## 2022-05-28 MED ORDER — CLINDAMYCIN PHOSPHATE 1 % EX GEL: Freq: Two times a day (BID) | CUTANEOUS | 1 refills | Status: AC

## 2022-05-28 NOTE — Progress Notes (Signed)
    GYNECOLOGY PROGRESS NOTE  Subjective:    Patient ID: Paige Rodriguez, female    DOB: 28-Jul-1996, 26 y.o.   MRN: [id number]  HPI  Patient is a 26 y.o. G66P1001 female who presents for lump in left breast. Patient has some concerns about lump on the bottom left breast. She noticed lump about 2 weeks ago. She gets lumps or bumps on her breast frequently, but they usually resolve fairly quickly (within 2-3 days). This one has persisted for ~ 2 weeks. She denies pain, redness, swelling or drainage in the area. Risks for breast cancer: none.      The following portions of the patient's history were reviewed and updated as appropriate: allergies, current medications, past family history, past medical history, past social history, past surgical history, and problem list.  Review of Systems Pertinent items noted in HPI and remainder of comprehensive ROS otherwise negative.   Objective:   Blood pressure 109/74, pulse 79, resp. rate 16, height 5\' 7"  (1.702 m), weight 227 lb 4.8 oz (103.1 kg), last menstrual period 05/25/2022. Body mass index is 35.6 kg/m. General appearance: alert, cooperative, and no distress Breasts: breasts appear normal, no suspicious masses, no skin or nipple changes or axillary nodes. Small subcutaneous palpable nodule (~ 0.5 x 0.5 cm) in upper crease of left breast with area of mild scarring.   Assessment:    Subcutaneous breast nodule (possible inclusion cyst)  Plan:   - Reassured of benign findings. Advised on decreasing moisture with use of powders or deoderants.  - Prescribed Clindamycin gel if nodules become inflamed.   - Follow up as needed.    07/25/2022, MD [facility]

## 2022-06-19 ENCOUNTER — Encounter: Payer: Medicaid Other | Admitting: Obstetrics and Gynecology

## 2022-08-27 ENCOUNTER — Ambulatory Visit (INDEPENDENT_AMBULATORY_CARE_PROVIDER_SITE_OTHER): Payer: Medicaid Other | Admitting: Obstetrics and Gynecology

## 2022-08-27 ENCOUNTER — Encounter: Payer: Self-pay | Admitting: Obstetrics and Gynecology

## 2022-08-27 VITALS — BP 124/86 | HR 88 | Resp 16 | Ht 67.0 in | Wt 224.9 lb

## 2022-08-27 DIAGNOSIS — Z30011 Encounter for initial prescription of contraceptive pills: Secondary | ICD-10-CM | POA: Diagnosis not present

## 2022-08-27 LAB — POCT URINE PREGNANCY: Preg Test, Ur: NEGATIVE

## 2022-08-27 MED ORDER — BALCOLTRA 0.1-20 MG-MCG(21) PO TABS: 1.0000 | ORAL_TABLET | Freq: Every day | ORAL | 3 refills | Status: AC

## 2022-08-27 NOTE — Progress Notes (Signed)
    GYNECOLOGY PROGRESS NOTE  Subjective:    Patient ID: Paige Rodriguez, female    DOB: May 15, 1996, 26 y.o.   MRN: [id number]  HPI  Patient is a 26 y.o. G53P1001 female who presents for consultation for oral contraception. She had a Nexplanon previously. She had it removed in February.  Her cycles have mostly regulated she had implant removed. She would like to be on oral contraceptives. Her LMP was 08/10/2022.  The following portions of the patient's history were reviewed and updated as appropriate: allergies, current medications, past family history, past medical history, past social history, past surgical history, and problem list.  Review of Systems Pertinent items noted in HPI and remainder of comprehensive ROS otherwise negative.   Objective:   Blood pressure 124/86, pulse 88, resp. rate 16, height 5\' 7"  (1.702 m), weight 224 lb 14.4 oz (102 kg), last menstrual period 08/10/2022.  Body mass index is 35.22 kg/m. General appearance: alert and no distress CVS: S1 and S2 present.  Pulm: CTAB Abd: soft, non-tender.   Assessment:   1. Encounter for oral contraception initial prescription      Plan:   1. Encounter for oral contraception initial prescription - POCT urine pregnancy, negative.  - Prescribed Balcoltra. Recommend Sunday start after next cycle. Advised on back up method for first 2-4 weeks after starting. Give sample pack today.  - Return to clinic for any scheduled appointments or for any gynecologic concerns as needed.    Saturday, MD [facility]

## 2022-09-29 ENCOUNTER — Encounter: Payer: Self-pay | Admitting: Obstetrics and Gynecology
